# Patient Record
Sex: Female | Born: 1935
Health system: Southern US, Community
[De-identification: ages and names within clinical notes are randomized; demographics above are authoritative.]

## PROBLEM LIST (undated history)

## (undated) DIAGNOSIS — I10 Essential (primary) hypertension: Secondary | ICD-10-CM

## (undated) DIAGNOSIS — F0150 Vascular dementia without behavioral disturbance: Secondary | ICD-10-CM

## (undated) HISTORY — DX: Essential (primary) hypertension: I10

## (undated) HISTORY — PX: CATARACT EXTRACTION: SUR2

---

## 2005-01-17 ENCOUNTER — Ambulatory Visit: Payer: Self-pay | Admitting: Internal Medicine

## 2005-04-18 ENCOUNTER — Ambulatory Visit: Payer: Self-pay | Admitting: Internal Medicine

## 2006-08-21 ENCOUNTER — Ambulatory Visit: Payer: Self-pay | Admitting: Internal Medicine

## 2007-10-31 ENCOUNTER — Ambulatory Visit: Payer: Self-pay | Admitting: Internal Medicine

## 2008-09-10 ENCOUNTER — Emergency Department: Payer: Self-pay | Admitting: Emergency Medicine

## 2008-09-13 ENCOUNTER — Emergency Department: Payer: Self-pay | Admitting: Emergency Medicine

## 2009-01-16 ENCOUNTER — Ambulatory Visit: Payer: Self-pay | Admitting: Internal Medicine

## 2009-03-16 LAB — HM DEXA SCAN: HM Dexa Scan: ABNORMAL

## 2009-03-16 LAB — HM MAMMOGRAPHY: HM Mammogram: NORMAL

## 2009-03-30 ENCOUNTER — Ambulatory Visit: Payer: Self-pay | Admitting: Internal Medicine

## 2009-04-01 ENCOUNTER — Ambulatory Visit: Payer: Self-pay | Admitting: Internal Medicine

## 2009-10-04 ENCOUNTER — Encounter: Payer: Self-pay | Admitting: Internal Medicine

## 2009-10-14 ENCOUNTER — Encounter: Payer: Self-pay | Admitting: Internal Medicine

## 2009-11-13 ENCOUNTER — Encounter: Payer: Self-pay | Admitting: Internal Medicine

## 2010-02-28 ENCOUNTER — Encounter: Payer: Self-pay | Admitting: Orthopedic Surgery

## 2010-03-25 ENCOUNTER — Ambulatory Visit: Payer: Self-pay | Admitting: Internal Medicine

## 2010-04-14 ENCOUNTER — Ambulatory Visit: Payer: Self-pay | Admitting: Internal Medicine

## 2010-05-15 ENCOUNTER — Ambulatory Visit: Payer: Self-pay | Admitting: Internal Medicine

## 2010-11-02 ENCOUNTER — Encounter: Payer: Self-pay | Admitting: Internal Medicine

## 2010-11-02 ENCOUNTER — Ambulatory Visit (INDEPENDENT_AMBULATORY_CARE_PROVIDER_SITE_OTHER): Payer: MEDICARE | Admitting: Internal Medicine

## 2010-11-02 DIAGNOSIS — M25519 Pain in unspecified shoulder: Secondary | ICD-10-CM

## 2010-11-02 DIAGNOSIS — I10 Essential (primary) hypertension: Secondary | ICD-10-CM

## 2010-11-02 DIAGNOSIS — R5383 Other fatigue: Secondary | ICD-10-CM

## 2010-11-02 DIAGNOSIS — Z1239 Encounter for other screening for malignant neoplasm of breast: Secondary | ICD-10-CM

## 2010-11-02 DIAGNOSIS — E119 Type 2 diabetes mellitus without complications: Secondary | ICD-10-CM

## 2010-11-02 DIAGNOSIS — R5381 Other malaise: Secondary | ICD-10-CM

## 2010-11-02 DIAGNOSIS — M810 Age-related osteoporosis without current pathological fracture: Secondary | ICD-10-CM

## 2010-11-02 NOTE — Progress Notes (Signed)
  Subjective:    Patient ID: Kaitlin Edwards, female    DOB: 03-05-1920, 77 y.o.   MRN: [id number]  HPI  77 yo white female with history of DM , managed with oral medications, and glaucoma presesnts for regular followup.  Has been receiving regular intracular injections by Dr. Champ Mungo for sudden onset of vision loss secondary to macular degeneration diagnosed in  April 2012.  There has been improvement in vision note by patient since then.  She has brought a log of her fasting blood sugars for the past 3 months, which are all < 130.  No lows..  Denies numbness in feet,     Review of Systems  Constitutional: Negative for fever, chills and unexpected weight change.  HENT: Negative for hearing loss, ear pain, nosebleeds, congestion, sore throat, facial swelling, rhinorrhea, sneezing, mouth sores, trouble swallowing, neck pain, neck stiffness, voice change, postnasal drip, sinus pressure, tinnitus and ear discharge.   Eyes: Negative for pain, discharge, redness and visual disturbance.  Respiratory: Negative for cough, chest tightness, shortness of breath, wheezing and stridor.   Cardiovascular: Negative for chest pain, palpitations and leg swelling.  Musculoskeletal: Positive for arthralgias. Negative for myalgias.  Skin: Negative for color change and rash.  Neurological: Negative for dizziness, weakness, light-headedness and headaches.  Hematological: Negative for adenopathy.       Objective:   Physical Exam  Constitutional: She is oriented to person, place, and time. She appears well-developed and well-nourished.  HENT:  Mouth/Throat: Oropharynx is clear and moist.  Eyes: EOM are normal. Pupils are equal, round, and reactive to light. No scleral icterus.  Neck: Normal range of motion. Neck supple. No JVD present. No thyromegaly present.  Cardiovascular: Normal rate, regular rhythm, normal heart sounds and intact distal pulses.   Pulmonary/Chest: Effort normal and breath sounds normal.    Abdominal: Soft. Bowel sounds are normal. She exhibits no mass. There is no tenderness.  Musculoskeletal: Normal range of motion. She exhibits no edema.  Lymphadenopathy:    She has no cervical adenopathy.  Neurological: She is alert and oriented to person, place, and time. She displays normal reflexes. No cranial nerve deficit or sensory deficit. Coordination and gait normal.       Sensation intact to microfilament   Skin: Skin is warm and dry.  Psychiatric: She has a normal mood and affect.          Assessment & Plan:

## 2010-11-02 NOTE — Patient Instructions (Addendum)
Your blood pressure is elevated today.  You should be taking lisinopril 40 mg daily for your blood pressure.  Please confirm with your bottle at home and call us back.   Your diabetes control is excellent.  Continue your glipizide as you are doing..  You may use up to 4 tramadol daily if you need to for control of pain.    Ask Boyd Kerbs to draw a HgbA1c , CMET , TSH.    Return in 3 months.

## 2010-11-03 ENCOUNTER — Telehealth: Payer: Self-pay | Admitting: Internal Medicine

## 2010-11-03 ENCOUNTER — Other Ambulatory Visit: Payer: Self-pay | Admitting: Internal Medicine

## 2010-11-03 NOTE — Telephone Encounter (Signed)
Yes, i would like her to stay on the losartan.  I would like her ot have her bp checked a few times by the nurse at her facility to see if we are getting similar readings at home before we make any changes.

## 2010-11-03 NOTE — Telephone Encounter (Signed)
Notified patient of the message

## 2010-11-03 NOTE — Telephone Encounter (Signed)
Patient called and wanted to give the information that she did not have at her visit yesterday.  She stated she is taking losartan 100 mg once a day for BP, and she is also taking Tylenol PM at bedtime for sleep.  She wanted to know if you wanted to keep her on losartan because of her BP being elevated yesterday.  I have updated her chart.   Please advise

## 2010-11-05 ENCOUNTER — Encounter: Payer: Self-pay | Admitting: Internal Medicine

## 2010-11-05 DIAGNOSIS — E1139 Type 2 diabetes mellitus with other diabetic ophthalmic complication: Secondary | ICD-10-CM | POA: Insufficient documentation

## 2010-11-05 DIAGNOSIS — Z1239 Encounter for other screening for malignant neoplasm of breast: Secondary | ICD-10-CM | POA: Insufficient documentation

## 2010-11-05 DIAGNOSIS — M810 Age-related osteoporosis without current pathological fracture: Secondary | ICD-10-CM | POA: Insufficient documentation

## 2010-11-05 DIAGNOSIS — I10 Essential (primary) hypertension: Secondary | ICD-10-CM | POA: Insufficient documentation

## 2010-11-05 DIAGNOSIS — H409 Unspecified glaucoma: Secondary | ICD-10-CM | POA: Insufficient documentation

## 2010-11-05 NOTE — Assessment & Plan Note (Addendum)
Elevated today, with recent change from lisinopril to losartan. She will call us back to confirm what she is currently taking and will have [facility] RN check BP and pulse several times over the next week

## 2010-11-22 ENCOUNTER — Encounter: Payer: Self-pay | Admitting: Internal Medicine

## 2011-02-02 ENCOUNTER — Ambulatory Visit (INDEPENDENT_AMBULATORY_CARE_PROVIDER_SITE_OTHER): Payer: MEDICARE | Admitting: Internal Medicine

## 2011-02-02 ENCOUNTER — Encounter: Payer: Self-pay | Admitting: Internal Medicine

## 2011-02-02 VITALS — BP 140/80 | HR 78 | Temp 98.0°F | Ht 63.5 in | Wt 133.0 lb

## 2011-02-02 DIAGNOSIS — I10 Essential (primary) hypertension: Secondary | ICD-10-CM

## 2011-02-02 DIAGNOSIS — R430 Anosmia: Secondary | ICD-10-CM

## 2011-02-02 DIAGNOSIS — R5383 Other fatigue: Secondary | ICD-10-CM

## 2011-02-02 DIAGNOSIS — R439 Unspecified disturbances of smell and taste: Secondary | ICD-10-CM

## 2011-02-02 DIAGNOSIS — E119 Type 2 diabetes mellitus without complications: Secondary | ICD-10-CM

## 2011-02-02 NOTE — Progress Notes (Signed)
  Subjective:    Patient ID: Kaitlin Edwards, female    DOB: [birth year], 77 y.o.   MRN: [id number]  HPI      Review of Systems     Objective:   Physical Exam        Assessment & Plan:   Subjective:     [name] is a 77 y.o. female who presents for follow up of diabetes.. Current symptoms include: none. Patient denies hypoglycemia . Evaluation to date has been: fasting blood sugar, fasting lipid panel, hemoglobin A1C and microalbuminuria. Home sugars: BGs consistently in an acceptable range. Current treatments: Continued sulfonylurea which has been effective. Last dilated eye exam April 2012.  The following portions of the patient's history were reviewed and updated as appropriate: allergies, current medications, past family history, past medical history, past social history, past surgical history and problem list.  Review of Systems A comprehensive review of systems was negative except for: Ears, nose, mouth, throat, and face: positive for loss of sense of smell and taste    Objective:    BP 140/80  Pulse 78  Temp(Src) 98 F (36.7 C) (Oral)  Ht 5' 3.5" (1.613 m)  Wt 133 lb (60.328 kg)  BMI 23.19 kg/m2 General appearance: alert, cooperative and appears stated age Nose: Nares normal. Septum midline. Mucosa normal. No drainage or sinus tenderness. Throat: lips, mucosa, and tongue normal; teeth and gums normal Lungs: clear to auscultation bilaterally Heart: regular rate and rhythm, S1, S2 normal, no murmur, click, rub or gallop Extremities: extremities normal, atraumatic, no cyanosis or edema Pulses: 2+ and symmetric    @DMFOOTEXAM @  Patient was evaluated for proper footwear and sizing.  Laboratory: No components found with this basename: A1C      Assessment:    Diabetes mellitus Type II, under excellent control.    Plan:    Discussed general issues about diabetes pathophysiology and management. Encouraged aerobic exercise. Reminded to get yearly retinal  exam. Continued sulfonylurea; see medication orders.

## 2011-02-02 NOTE — Patient Instructions (Signed)
You do not have to check your sugar daily anymore,  Once a week will be fine,  Or more often if you feel bad.  We are checking your hgba1c today.  Return in 3 months

## 2011-02-03 LAB — COMPREHENSIVE METABOLIC PANEL
Alkaline Phosphatase: 65 U/L (ref 39–117)
CO2: 25 mEq/L (ref 19–32)
Creatinine, Ser: 0.9 mg/dL (ref 0.4–1.2)
GFR: 64.46 mL/min (ref >60.00)
Glucose, Bld: 84 mg/dL (ref 70–99)
Sodium: 138 mEq/L (ref 135–145)
Total Bilirubin: 1 mg/dL (ref 0.3–1.2)
Total Protein: 7.4 g/dL (ref 6.0–8.3)

## 2011-02-03 LAB — TSH: TSH: 0.94 u[IU]/mL (ref 0.35–5.50)

## 2011-02-05 DIAGNOSIS — R430 Anosmia: Secondary | ICD-10-CM | POA: Insufficient documentation

## 2011-02-05 NOTE — Assessment & Plan Note (Signed)
The source of her anosmia is unclear.  She has not had a recent URI and takes no medications that may be causing it.  Given her age need to rule out CNS tumor.  MRi ordered.

## 2011-02-10 ENCOUNTER — Ambulatory Visit: Payer: Self-pay | Admitting: Internal Medicine

## 2011-02-13 ENCOUNTER — Telehealth: Payer: Self-pay | Admitting: Internal Medicine

## 2011-02-13 DIAGNOSIS — R432 Parageusia: Secondary | ICD-10-CM

## 2011-02-13 NOTE — Telephone Encounter (Signed)
Her MRI of brain which was done because of loss of taste and smell did not show an acute stroke  Or a mass or tumor. If she is still having decreased taste and smell I recommend we refer to  ENT.

## 2011-02-15 NOTE — Telephone Encounter (Signed)
[facility] referral put in through [facility]

## 2011-02-15 NOTE — Telephone Encounter (Signed)
Advised pt of results.  She is still having the problems with decreased smell and taste.  She agrees to referral, wants to see someone in [place].

## 2011-02-15 NOTE — Telephone Encounter (Signed)
Addended by: Duncan Dull on: 02/15/2011 05:55 PM   Modules accepted: Orders

## 2011-02-21 ENCOUNTER — Encounter: Payer: Self-pay | Admitting: Internal Medicine

## 2011-03-09 LAB — HM DIABETES EYE EXAM

## 2011-03-20 ENCOUNTER — Telehealth: Payer: Self-pay | Admitting: Internal Medicine

## 2011-03-20 MED ORDER — GLIPIZIDE 5 MG PO TABS: 5.0000 mg | ORAL_TABLET | Freq: Two times a day (BID) | ORAL | Status: DC

## 2011-03-20 MED ORDER — LOSARTAN POTASSIUM 100 MG PO TABS: 100.0000 mg | ORAL_TABLET | Freq: Every day | ORAL | Status: DC

## 2011-03-20 NOTE — Telephone Encounter (Signed)
[phone] Pt needs refill rx glipizide and losartan [facility]   Mail order pharmacy

## 2011-03-27 ENCOUNTER — Telehealth: Payer: Self-pay | Admitting: Internal Medicine

## 2011-03-27 MED ORDER — LOSARTAN POTASSIUM 100 MG PO TABS: 100.0000 mg | ORAL_TABLET | Freq: Every day | ORAL | Status: DC

## 2011-03-27 MED ORDER — GLIPIZIDE 5 MG PO TABS: ORAL_TABLET | ORAL | Status: DC

## 2011-03-27 NOTE — Telephone Encounter (Signed)
Left message for patient to call back  

## 2011-03-27 NOTE — Telephone Encounter (Signed)
Office Message [address] [place], [place] [address] p. [phone] f. [phone] To: [facility] (Daytime Triage) Fax: [phone] From: [facility] Date/ Time: 03/27/2011 2:01 PM Taken By: Annalee Genta, CSR Caller: Myriam Jacobson Facility: not collected Patient: Kedra, Mcglade DOB: October 12, 1920 Phone: [phone] Reason for Call: Please call pt re refills for Losartan and Glipizide. She would like these mailed to her home ([address], [address] [place], [place] [address] )but is almost out of Glipizide. Please call pt to advise. Jacki Cones had left message for her, please don't leave message but call back) Regarding Appointment: Appt Date: Appt Time: Unknown Provider: Reason: Details: Outcome:

## 2011-03-27 NOTE — Telephone Encounter (Signed)
[phone] PT CALLED CHECKING ON HER RX SHE WILL BE OUT OF HER GLIPIZIDE THIS WEEK.  SHE WILL BE OUT ON Thursday. AND HER LOSARTIN IS ALMOST OUT   PLEASE ADVISE WHEN THIS IS CALLED IN OR HAS THIS BEEN CALLED IN THE REQUEST WAS MADE 2/4

## 2011-03-27 NOTE — Telephone Encounter (Signed)
Meds sent to [facility], pt advised.

## 2011-03-31 ENCOUNTER — Telehealth: Payer: Self-pay | Admitting: Internal Medicine

## 2011-04-03 NOTE — Telephone Encounter (Signed)
Left message for patient to call back  

## 2011-04-04 NOTE — Telephone Encounter (Signed)
Called [facility] and clarified directions on glypizide script.  Pt states that she is taking one whole tablet in the morning and one half in the evening.  [facility] will send pt her medicine.

## 2011-05-04 ENCOUNTER — Encounter: Payer: Self-pay | Admitting: Internal Medicine

## 2011-05-04 ENCOUNTER — Ambulatory Visit (INDEPENDENT_AMBULATORY_CARE_PROVIDER_SITE_OTHER): Payer: MEDICARE | Admitting: Internal Medicine

## 2011-05-04 VITALS — BP 148/76 | HR 100 | Temp 98.0°F | Resp 14 | Wt 132.2 lb

## 2011-05-04 DIAGNOSIS — R430 Anosmia: Secondary | ICD-10-CM

## 2011-05-04 DIAGNOSIS — E119 Type 2 diabetes mellitus without complications: Secondary | ICD-10-CM

## 2011-05-04 DIAGNOSIS — R439 Unspecified disturbances of smell and taste: Secondary | ICD-10-CM

## 2011-05-04 DIAGNOSIS — Z1239 Encounter for other screening for malignant neoplasm of breast: Secondary | ICD-10-CM

## 2011-05-04 DIAGNOSIS — I10 Essential (primary) hypertension: Secondary | ICD-10-CM

## 2011-05-04 NOTE — Assessment & Plan Note (Addendum)
Controlled on losartan.  Stable renal function

## 2011-05-04 NOTE — Progress Notes (Signed)
Patient ID: Kaitlin Edwards, female   DOB: 01-20-1921, 78 y.o.   MRN: [id number]   Patient Active Problem List  Diagnoses  . Type 2 diabetes mellitus, controlled  . Glaucoma  . Hypertension  . Screening for breast cancer  . Osteoporosis, post-menopausal  . Anosmia    Subjective:  CC:   Chief Complaint  Patient presents with  . Follow-up    3 month    HPI:   Kaitlin Musseris a 78 y.o. female who presents for 3 month  followup on diabetes mellitus type 2.  She has been compliant with use of glipizide , 5 mg in the morning and 2.5 mg in the evening.  She has brought a list of her morning fasting sugars.  All.  fastings all < 120.  She denies recent lows.  She has been having a lot of gas but not cramping.  Her diet is rich in Cruciferous vegetables. She denies constipation or diarrhea. Her bowels moving regularly. She has occasional insomnia but uses  tylenol PM at bedtime ,  she has been having nocturia x 3.  She drinks at least 16 ounces of water in the evening after dinner. She has not had a urinalysis in recent months.  She is up-to-date with her eye exams. She denies any numbness or tingling in her feet and has no leg pain.   Past Medical History  Diagnosis Date  . Diabetes mellitus     diet controlled  . Glaucoma   . Hypertension     Past Surgical History  Procedure Date  . Cataract extraction          The following portions of the patient's history were reviewed and updated as appropriate: Allergies, current medications, and problem list.    Review of Systems:   12 Pt  review of systems was negative except those addressed in the HPI,     History   Social History  . Marital Status: Married    Spouse Name: N/A    Number of Children: N/A  . Years of Education: N/A   Occupational History  . Not on file.   Social History Main Topics  . Smoking status: Never Smoker   . Smokeless tobacco: Never Used  . Alcohol Use: Yes     occasional  . Drug Use: No  .  Sexually Active: Not on file   Other Topics Concern  . Not on file   Social History Narrative  . No narrative on file    Objective:  BP 148/76  Pulse 100  Temp(Src) 98 F (36.7 C) (Oral)  Resp 14  Wt 132 lb 4 oz (59.988 kg)  SpO2 98%  General appearance: alert, cooperative and appears stated age Ears: normal TM's and external ear canals both ears Throat: lips, mucosa, and tongue normal; teeth and gums normal Neck: no adenopathy, no carotid bruit, supple, symmetrical, trachea midline and thyroid not enlarged, symmetric, no tenderness/mass/nodules Back: symmetric, no curvature. ROM normal. No CVA tenderness. Lungs: clear to auscultation bilaterally Heart: regular rate and rhythm, S1, S2 normal, no murmur, click, rub or gallop Abdomen: soft, non-tender; bowel sounds normal; no masses,  no organomegaly Pulses: 2+ and symmetric Skin: Skin color, texture, turgor normal. No rashes or lesions Lymph nodes: Cervical, supraclavicular, and axillary nodes normal. Foot exam:  Nails are well trimmed,  No callouses,  Sensation intact to microfilament in 12 of 12 locations bilaterally.  Assessment and Plan:  Hypertension Controlled on losartan.  Stable renal function  Type 2 diabetes mellitus, controlled Her hemoglobin A1c is 6.7 reflecting excellent control. She has no signs of neuropathy. Her renal function is stable she is on losartan for  hypertension. She has no history of microalbuminuria . She is not on a statin given her extreme age. She has regular ophthalmology  Follow up with Dr. Champ Mungo for history of glaucoma and wet macular degeneration. Her foot exam was normal today.  Anosmia Etiology unclear. She had an MRI of the brain which was negative for any masses or strokes. She was then seen by Dr.Madison Chestine Spore of [facility] who found  no evidence of polyps or nasopharyngeal masses. He suggested that she use a steroid nasal spray for management of congestive symptoms which were  likely viral  in etiology  Screening for breast cancer She is due for repeat mammogram this will be scheduled for her today.    Updated Medication List Outpatient Encounter Prescriptions as of 05/04/2011  Medication Sig Dispense Refill  . aspirin 81 MG tablet Take 81 mg by mouth daily.      . brinzolamide (AZOPT) 1 % ophthalmic suspension 1 drop 2 (two) times daily.        . Calcium Carbonate-Vitamin D (CALCIUM + D) 600-200 MG-UNIT TABS Take 1 tablet by mouth 2 (two) times daily.        . cyanocobalamin (,VITAMIN B-12,) 1000 MCG/ML injection Inject 1,000 mcg into the muscle every 30 (thirty) days.        . fish oil-omega-3 fatty acids 1000 MG capsule Take 2 g by mouth daily.        Marland Kitchen glipiZIDE (GLUCOTROL) 5 MG tablet Take one tablet in the morning, one half tablet in the evening.  135 tablet  3  . glucose blood (ACCU-CHEK AVIVA) test strip 1 each by Other route 2 (two) times daily. Use as instructed       . Multiple Vitamins-Minerals (ICAPS PO) Take by mouth.        . traMADol (ULTRAM) 50 MG tablet Take 50 mg by mouth every 6 (six) hours as needed.        Marland Kitchen DISCONTD: diphenhydramine-acetaminophen (TYLENOL PM) 25-500 MG TABS Take 1 tablet by mouth at bedtime as needed.        Marland Kitchen DISCONTD: lisinopril (PRINIVIL,ZESTRIL) 20 MG tablet Take 20 mg by mouth 2 (two) times daily.      Marland Kitchen losartan (COZAAR) 100 MG tablet       . DISCONTD: gentamicin (GARAMYCIN) 0.3 % ophthalmic solution 1 drop every 4 (four) hours.        Marland Kitchen DISCONTD: losartan (COZAAR) 100 MG tablet Take 1 tablet (100 mg total) by mouth daily.  90 tablet  3     Orders Placed This Encounter  Procedures  . MM Digital Screening  . Hemoglobin A1c  . COMPLETE METABOLIC PANEL WITH GFR  . HM DIABETES EYE EXAM  . HM DIABETES FOOT EXAM    Return in about 3 months (around 08/04/2011).

## 2011-05-05 LAB — COMPLETE METABOLIC PANEL WITH GFR
Alkaline Phosphatase: 58 U/L (ref 39–117)
BUN: 20 mg/dL (ref 6–23)
CO2: 24 mEq/L (ref 19–32)
Creat: 0.82 mg/dL (ref 0.50–1.10)
GFR, Est African American: 74 mL/min
GFR, Est Non African American: 64 mL/min
Glucose, Bld: 62 mg/dL — ABNORMAL LOW (ref 70–99)
Total Bilirubin: 0.7 mg/dL (ref 0.3–1.2)
Total Protein: 6.8 g/dL (ref 6.0–8.3)

## 2011-05-07 NOTE — Assessment & Plan Note (Signed)
She is due for repeat mammogram this will be scheduled for her today.

## 2011-05-07 NOTE — Assessment & Plan Note (Signed)
Etiology unclear. She had an MRI of the brain which was negative for any masses or strokes. She was then seen by Dr.Madison Chestine Spore of [facility] who found  no evidence of polyps or nasopharyngeal masses. He suggested that she use a steroid nasal spray for management of congestive symptoms which were likely viral  in etiology

## 2011-05-07 NOTE — Assessment & Plan Note (Addendum)
Her hemoglobin A1c is 6.7 reflecting excellent control. She has no signs of neuropathy. Her renal function is stable she is on losartan for  hypertension. She has no history of microalbuminuria . She is not on a statin given her extreme age. She has regular ophthalmology  Follow up with Dr. Champ Mungo for history of glaucoma and wet macular degeneration. Her foot exam was normal today.

## 2011-05-30 ENCOUNTER — Telehealth: Payer: Self-pay | Admitting: Internal Medicine

## 2011-05-30 NOTE — Telephone Encounter (Signed)
i call pt it see if she had her mammogram schedule or if she had went.  Pt wanted to know with her being 78 years old is it necessary for her to have mammogram She goes to [facility]

## 2011-05-31 NOTE — Telephone Encounter (Signed)
Patient notified, she stated that was fine she would just discuss it with you at her visit.

## 2011-05-31 NOTE — Telephone Encounter (Signed)
It is not necessary to continue getting annual mammograms at her age.  We can either stop all together or  Decrease the frequemncy to evrry other year,  So do not schedule,  We will dicuss more at next visit.

## 2011-06-28 ENCOUNTER — Other Ambulatory Visit: Payer: Self-pay | Admitting: Internal Medicine

## 2011-06-28 MED ORDER — CYANOCOBALAMIN 1000 MCG/ML IJ SOLN: 1000.0000 ug | INTRAMUSCULAR | Status: DC

## 2011-07-10 ENCOUNTER — Inpatient Hospital Stay: Payer: Self-pay | Admitting: Internal Medicine

## 2011-07-10 LAB — COMPREHENSIVE METABOLIC PANEL
Albumin: 4 g/dL (ref 3.4–5.0)
Anion Gap: 10 (ref 7–16)
BUN: 17 mg/dL (ref 7–18)
Calcium, Total: 8.9 mg/dL (ref 8.5–10.1)
Chloride: 104 mmol/L (ref 98–107)
Co2: 23 mmol/L (ref 21–32)
Creatinine: 0.8 mg/dL (ref 0.60–1.30)
EGFR (Non-African Amer.): >60
Glucose: 197 mg/dL — ABNORMAL HIGH (ref 65–99)
Osmolality: 281 (ref 275–301)
Potassium: 3.8 mmol/L (ref 3.5–5.1)
SGOT(AST): 19 U/L (ref 15–37)
SGPT (ALT): 25 U/L
Sodium: 137 mmol/L (ref 136–145)
Total Protein: 7.9 g/dL (ref 6.4–8.2)

## 2011-07-10 LAB — CBC
HCT: 40.3 % (ref 35.0–47.0)
HGB: 13.6 g/dL (ref 12.0–16.0)
MCH: 29.4 pg (ref 26.0–34.0)
MCV: 87 fL (ref 80–100)
Platelet: 271 10*3/uL (ref 150–440)
RDW: 13.6 % (ref 11.5–14.5)

## 2011-07-10 LAB — MAGNESIUM: Magnesium: 1.9 mg/dL

## 2011-07-10 LAB — CK TOTAL AND CKMB (NOT AT ARMC)
CK, Total: 79 U/L (ref 21–215)
CK, Total: 85 U/L (ref 21–215)
CK-MB: 1.8 ng/mL (ref 0.5–3.6)
CK-MB: 2 ng/mL (ref 0.5–3.6)

## 2011-07-10 LAB — TROPONIN I
Troponin-I: 0.04 ng/mL
Troponin-I: 0.04 ng/mL

## 2011-07-11 ENCOUNTER — Telehealth: Payer: Self-pay | Admitting: Internal Medicine

## 2011-07-11 LAB — TROPONIN I: Troponin-I: 0.03 ng/mL

## 2011-07-11 LAB — LIPID PANEL
Cholesterol: 217 mg/dL — ABNORMAL HIGH (ref 0–200)
HDL Cholesterol: 83 mg/dL — ABNORMAL HIGH (ref 40–60)
Ldl Cholesterol, Calc: 122 mg/dL — ABNORMAL HIGH (ref 0–100)
Triglycerides: 59 mg/dL (ref 0–200)
VLDL Cholesterol, Calc: 12 mg/dL (ref 5–40)

## 2011-07-11 LAB — HEMOGLOBIN A1C: Hemoglobin A1C: 6.7 % — ABNORMAL HIGH (ref 4.2–6.3)

## 2011-07-11 LAB — CK TOTAL AND CKMB (NOT AT ARMC)
CK, Total: 82 U/L (ref 21–215)
CK-MB: 2 ng/mL (ref 0.5–3.6)

## 2011-07-11 NOTE — Telephone Encounter (Signed)
Patient is being discharged today from [facility].

## 2011-07-12 NOTE — Telephone Encounter (Signed)
Spoke with patient, she stated she is doing much better since she was in the hospital and she will bring her discharge summary and all medications with her to the appointment made on June 6th.  She was advised to call the office if she had any concerns prior to the appt.

## 2011-07-13 ENCOUNTER — Encounter: Payer: Self-pay | Admitting: Cardiovascular Disease

## 2011-07-17 ENCOUNTER — Ambulatory Visit: Payer: MEDICARE | Admitting: Internal Medicine

## 2011-07-20 ENCOUNTER — Encounter: Payer: Self-pay | Admitting: Internal Medicine

## 2011-07-20 ENCOUNTER — Ambulatory Visit (INDEPENDENT_AMBULATORY_CARE_PROVIDER_SITE_OTHER): Payer: MEDICARE | Admitting: Internal Medicine

## 2011-07-20 ENCOUNTER — Encounter: Payer: Self-pay | Admitting: Cardiovascular Disease

## 2011-07-20 VITALS — BP 142/74 | HR 67 | Temp 97.8°F | Resp 16 | Ht 63.0 in | Wt 131.5 lb

## 2011-07-20 DIAGNOSIS — I447 Left bundle-branch block, unspecified: Secondary | ICD-10-CM

## 2011-07-20 DIAGNOSIS — E119 Type 2 diabetes mellitus without complications: Secondary | ICD-10-CM

## 2011-07-20 DIAGNOSIS — I10 Essential (primary) hypertension: Secondary | ICD-10-CM

## 2011-07-20 MED ORDER — METOPROLOL TARTRATE 50 MG PO TABS: 25.0000 mg | ORAL_TABLET | Freq: Two times a day (BID) | ORAL | Status: DC

## 2011-07-20 MED ORDER — HYDROCHLOROTHIAZIDE 25 MG PO TABS: 25.0000 mg | ORAL_TABLET | Freq: Every day | ORAL | Status: DC

## 2011-07-20 NOTE — Patient Instructions (Addendum)
The metoprolol may be making you too tired.    Reduce the metoprolol to 1/2 tablet twice daily   (about 12 hours apart)  We are adding  HCTZ (a fluid pill) daily in the morning for relief of nighttime urination and for blood pressure  Continue losartan as you are doing, once daily, for blood pressure.   Have the RN at [facility] check  Fasting lasbs(blood test) on June 21st or later.  Do NOT take your glipizide that morning, but continue to take your medications for blood pressure .  (Take your glipizide with your breakfast afterward)

## 2011-07-20 NOTE — Progress Notes (Signed)
Patient ID: Kaitlin Edwards, female   DOB: 1920/02/17, 78 y.o.   MRN: [id number]  Patient Active Problem List  Diagnoses  . Type 2 diabetes mellitus, controlled  . Glaucoma  . Hypertension  . Screening for breast cancer  . Osteoporosis, post-menopausal  . Anosmia  . Left bundle branch block    Subjective:  CC:   Chief Complaint  Patient presents with  . Follow-up    Hospital    HPI:   Kaitlin Musseris a 78 y.o. female who presents  for hospital follow up.  She was admitted for accelerated hypertension with epistaxis on [facility] 07/10/11 and discharged on May 28 with an additional medication , metoprolol 50 mg q 12 hours.   Had an iatrogenic hypoglycemic episode  after receiving insulin in house.  Has resumed her evening glipizde bc she has not had any lows either before or after admission.  She was referred to Dr. Kirke Corin a outpatient for evlaution of a new LBBB nad sees him on Monday .  Her BPs since starting metoprolol are improved, with daily readings 120/50  Pulse 60 to 162/84(rare) and feels tired a lot but continues with her acitivites.  Has nocturia x 3 which is disruptive.  She has had some wt gain but denies orthopnea and fluid retention.   Past Medical History  Diagnosis Date  . Diabetes mellitus     diet controlled  . Glaucoma   . Hypertension     Past Surgical History  Procedure Date  . Cataract extraction          The following portions of the patient's history were reviewed and updated as appropriate: Allergies, current medications, and problem list.    Review of Systems:   12 Pt  review of systems was negative except those addressed in the HPI,     History   Social History  . Marital Status: Married    Spouse Name: N/A    Number of Children: N/A  . Years of Education: N/A   Occupational History  . Not on file.   Social History Main Topics  . Smoking status: Never Smoker   . Smokeless tobacco: Never Used  . Alcohol Use: No     occasional  .  Drug Use: No  . Sexually Active: Not on file   Other Topics Concern  . Not on file   Social History Narrative  . No narrative on file    Objective:  BP 142/74  Pulse 67  Temp(Src) 97.8 F (36.6 C) (Oral)  Resp 16  Ht 5\' 3"  (1.6 m)  Wt 131 lb 8 oz (59.648 kg)  BMI 23.29 kg/m2  SpO2 97%  General appearance: alert, cooperative and appears stated age Ears: normal TM's and external ear canals both ears Throat: lips, mucosa, and tongue normal; teeth and gums normal Neck: no adenopathy, no carotid bruit, supple, symmetrical, trachea midline and thyroid not enlarged, symmetric, no tenderness/mass/nodules Back: symmetric, no curvature. ROM normal. No CVA tenderness. Lungs: clear to auscultation bilaterally Heart: regular rate and rhythm, S1, S2 normal, no murmur, click, rub or gallop Abdomen: soft, non-tender; bowel sounds normal; no masses,  no organomegaly Pulses: 2+ and symmetric Skin: Skin color, texture, turgor normal. No rashes or lesions Lymph nodes: Cervical, supraclavicular, and axillary nodes normal.  Assessment and Plan:  Hypertension Controlled with addition of metoprolol  Added to losartan and hctz, started in house.   Type 2 diabetes mellitus, controlled No changes to medication.  Continue glipizide.   Left  bundle branch block She has no cardiac history and no prior EKG for comparison.  She has been referred to Dr. Kirke Corin for cardiologic evaluation in the setting of diabetes and hypertension.     Updated Medication List Outpatient Encounter Prescriptions as of 07/20/2011  Medication Sig Dispense Refill  . Acetaminophen (TYLENOL PO) Take by mouth as needed.      Marland Kitchen aspirin 81 MG tablet Take 81 mg by mouth daily.      . brinzolamide (AZOPT) 1 % ophthalmic suspension 1 drop 2 (two) times daily.        . Calcium Carbonate-Vitamin D (CALCIUM + D) 600-200 MG-UNIT TABS Take 1 tablet by mouth 2 (two) times daily.        . cyanocobalamin (,VITAMIN B-12,) 1000 MCG/ML  injection Inject 1 mL (1,000 mcg total) into the muscle every 30 (thirty) days.  10 mL  3  . docusate sodium (COLACE) 100 MG capsule Take 100 mg by mouth daily.      . fish oil-omega-3 fatty acids 1000 MG capsule Take 1,000 mg by mouth daily.       Marland Kitchen glipiZIDE (GLUCOTROL) 5 MG tablet Take 5 mg by mouth daily.      Marland Kitchen glucose blood (ACCU-CHEK AVIVA) test strip 1 each by Other route 2 (two) times daily. Use as instructed       . losartan (COZAAR) 100 MG tablet Take 100 mg by mouth daily.       . Multiple Vitamins-Minerals (ICAPS PO) Take by mouth.        . Simethicone (GAS RELIEF PO) Take by mouth. Takes 1-2 tablets prn      . traMADol (ULTRAM) 50 MG tablet Take 50 mg by mouth every 6 (six) hours as needed.        Marland Kitchen DISCONTD: glipiZIDE (GLUCOTROL) 5 MG tablet Take one tablet in the morning, one half tablet in the evening.  135 tablet  3  . DISCONTD: metoprolol succinate (TOPROL-XL) 50 MG 24 hr tablet Take 50 mg by mouth 2 (two) times daily. Take with or immediately following a meal.      . hydrochlorothiazide (HYDRODIURIL) 25 MG tablet Take 1 tablet (25 mg total) by mouth daily.  30 tablet  1  . metoprolol (LOPRESSOR) 50 MG tablet Take 0.5 tablets (25 mg total) by mouth 2 (two) times daily.  180 tablet  3     No orders of the defined types were placed in this encounter.    Return in about 3 months (around 10/20/2011).

## 2011-07-23 DIAGNOSIS — I447 Left bundle-branch block, unspecified: Secondary | ICD-10-CM | POA: Insufficient documentation

## 2011-07-23 NOTE — Assessment & Plan Note (Signed)
No changes to medication.  Continue glipizide.

## 2011-07-23 NOTE — Assessment & Plan Note (Addendum)
Well controlled with addition of metoprolol, but she is having excessive fatigue.  Will decrease the metoprolol continue losartan and add hctz.

## 2011-07-23 NOTE — Assessment & Plan Note (Signed)
She has no cardiac history and no prior EKG for comparison.  She has been referred to Dr. Kirke Corin for cardiologic evaluation in the setting of diabetes and hypertension.

## 2011-07-24 ENCOUNTER — Other Ambulatory Visit: Payer: Self-pay | Admitting: Internal Medicine

## 2011-07-24 ENCOUNTER — Ambulatory Visit (INDEPENDENT_AMBULATORY_CARE_PROVIDER_SITE_OTHER): Payer: Medicare Other | Admitting: Cardiovascular Disease

## 2011-07-24 ENCOUNTER — Encounter: Payer: Self-pay | Admitting: Cardiovascular Disease

## 2011-07-24 VITALS — BP 140/80 | HR 66 | Ht 63.0 in | Wt 131.5 lb

## 2011-07-24 DIAGNOSIS — I447 Left bundle-branch block, unspecified: Secondary | ICD-10-CM

## 2011-07-24 DIAGNOSIS — I10 Essential (primary) hypertension: Secondary | ICD-10-CM

## 2011-07-24 DIAGNOSIS — I509 Heart failure, unspecified: Secondary | ICD-10-CM

## 2011-07-24 DIAGNOSIS — I5022 Chronic systolic (congestive) heart failure: Secondary | ICD-10-CM

## 2011-07-24 NOTE — Assessment & Plan Note (Signed)
The patient was recently diagnosed with cardiomyopathy with an ejection fraction of 40% in the setting of uncontrolled hypertension. She is currently New York Heart Association class II and appears to be euvolemic. I discussed the etiology of cardiomyopathy and in her situation is most likely due to either hypertensive heart disease or ischemic heart disease. The wall motion abnormalities on the echocardiogram was likely due to left bundle branch block. However, underlying ischemia cannot be completely ruled out. I discussed with her different management options. Due to her age and lack of significant symptoms of angina or heart failure, it's reasonable to treat her medically initially and monitor her symptoms. She was already started her metoprolol in addition to losartan. If her symptoms worsen, we can consider proceeding with a pharmacologic nuclear stress test or even cardiac catheterization if needed.

## 2011-07-24 NOTE — Patient Instructions (Signed)
Check your blood pressure 3 times a week. Let us know if it is more than 150/90.  Continue same medications.  Follow up in 3 months.

## 2011-07-24 NOTE — Progress Notes (Signed)
HPI  Kaitlin Musseris a 78 y.o. female who was referred from [facility] for evaluation of newly diagnosed cardiomyopathy and an abnormal ECG. She was admitted for accelerated hypertension with epistaxis on [facility] 07/10/11 and discharged on May 28 with an additional medication , metoprolol 50 mg q 12 hours. Had an iatrogenic hypoglycemic episode after receiving insulin in house. Has resumed her evening glipizde bc she has not had any lows either before or after admission. Her BPs since starting metoprolol are improved, with daily readings 120/50 Pulse 60 to 162/84(rare) and feels tired a lot but continues with her acitivites.  She had an echocardiogram done which showed an ejection fraction of 40% with septal motion abnormalities suggestive of conduction disease as well as septal and distal anterior hypokinesis with evidence of moderate pulmonary hypertension. The patient is not aware of any previous cardiac history. She denies any chest pain, dyspnea orthopnea. She is actually very active and does water aerobics twice a week and regular aerobic exercise 3 times a week without significant limitations. She is able to go up 3 flights of stairs without symptoms.   No Known Allergies   Current Outpatient Prescriptions on File Prior to Visit  Medication Sig Dispense Refill  . Acetaminophen (TYLENOL PO) Take by mouth as needed.      Marland Kitchen aspirin 81 MG tablet Take 81 mg by mouth daily.      . brinzolamide (AZOPT) 1 % ophthalmic suspension 1 drop 2 (two) times daily.        . Calcium Carbonate-Vitamin D (CALCIUM + D) 600-200 MG-UNIT TABS Take 1 tablet by mouth 2 (two) times daily.        . cyanocobalamin (,VITAMIN B-12,) 1000 MCG/ML injection Inject 1 mL (1,000 mcg total) into the muscle every 30 (thirty) days.  10 mL  3  . docusate sodium (COLACE) 100 MG capsule Take 100 mg by mouth daily.      . fish oil-omega-3 fatty acids 1000 MG capsule Take 1,000 mg by mouth daily.       Marland Kitchen glipiZIDE (GLUCOTROL) 5 MG tablet  Take 1 tablet in the am and 1/2 tablet in the pm.      . glucose blood (ACCU-CHEK AVIVA) test strip 1 each by Other route 2 (two) times daily. Use as instructed       . hydrochlorothiazide (HYDRODIURIL) 25 MG tablet Take 1 tablet (25 mg total) by mouth daily.  30 tablet  1  . losartan (COZAAR) 100 MG tablet Take 100 mg by mouth daily.       . Multiple Vitamins-Minerals (ICAPS PO) Take by mouth.        . Simethicone (GAS RELIEF PO) Take by mouth. Takes 1-2 tablets prn      . traMADol (ULTRAM) 50 MG tablet Take 50 mg by mouth every 6 (six) hours as needed.        Marland Kitchen DISCONTD: diphenhydramine-acetaminophen (TYLENOL PM) 25-500 MG TABS Take 1 tablet by mouth at bedtime as needed.           Past Medical History  Diagnosis Date  . Diabetes mellitus     diet controlled  . Glaucoma   . Hypertension      Past Surgical History  Procedure Date  . Cataract extraction      Family History  Problem Relation Age of Onset  . Parkinsonism Mother   . Hypertension Brother      History   Social History  . Marital Status: Married  Spouse Name: N/A    Number of Children: N/A  . Years of Education: N/A   Occupational History  . Not on file.   Social History Main Topics  . Smoking status: Never Smoker   . Smokeless tobacco: Never Used  . Alcohol Use: No     occasional  . Drug Use: No  . Sexually Active: Not on file   Other Topics Concern  . Not on file   Social History Narrative  . No narrative on file     ROS Constitutional: Negative for fever, chills, diaphoresis, activity change, appetite change and fatigue.  HENT: Negative for hearing loss, nosebleeds, congestion, sore throat, facial swelling, drooling, trouble swallowing, neck pain, voice change, sinus pressure and tinnitus.  Eyes: Negative for photophobia, pain, discharge and visual disturbance.  Respiratory: Negative for apnea, cough, chest tightness, shortness of breath and wheezing.  Cardiovascular: Negative for chest  pain, palpitations and leg swelling.  Gastrointestinal: Negative for nausea, vomiting, abdominal pain, diarrhea, constipation, blood in stool and abdominal distention.  Genitourinary: Negative for dysuria, urgency, frequency, hematuria and decreased urine volume.  Musculoskeletal: Negative for myalgias, back pain, joint swelling, arthralgias and gait problem.  Skin: Negative for color change, pallor, rash and wound.  Neurological: Negative for dizziness, tremors, seizures, syncope, speech difficulty, weakness, light-headedness, numbness and headaches.  Psychiatric/Behavioral: Negative for suicidal ideas, hallucinations, behavioral problems and agitation. The patient is not nervous/anxious.     PHYSICAL EXAM   BP 140/80  Pulse 66  Ht 5\' 3"  (1.6 m)  Wt 131 lb 8 oz (59.648 kg)  BMI 23.29 kg/m2 Constitutional: She is oriented to person, place, and time. She appears well-developed and well-nourished. No distress.  HENT: No nasal discharge.  Head: Normocephalic and atraumatic.  Eyes: Pupils are equal and round. Right eye exhibits no discharge. Left eye exhibits no discharge.  Neck: Normal range of motion. Neck supple. No JVD present. No thyromegaly present.  Cardiovascular: Normal rate, regular rhythm, normal heart sounds. Exam reveals no gallop and no friction rub. There is a 1/6 systolic ejection murmur at the left sternal border.  Pulmonary/Chest: Effort normal and breath sounds normal. No stridor. No respiratory distress. She has no wheezes. She has no rales. She exhibits no tenderness.  Abdominal: Soft. Bowel sounds are normal. She exhibits no distension. There is no tenderness. There is no rebound and no guarding.  Musculoskeletal: Normal range of motion. She exhibits no edema and no tenderness.  Neurological: She is alert and oriented to person, place, and time. Coordination normal.  Skin: Skin is warm and dry. No rash noted. She is not diaphoretic. No erythema. No pallor.  Psychiatric:  She has a normal mood and affect. Her behavior is normal. Judgment and thought content normal.     EKG: Sinus  Rhythm  -First degree A-V block  -With run of ventricular ectopic beats  PRi = 244 -  Negative T-waves  - Anterior ischemia.  (Intermittent left bundle branch block)   ASSESSMENT AND PLAN

## 2011-07-24 NOTE — Assessment & Plan Note (Signed)
Her blood pressure seems to be better controlled. She initially did not tolerate higher dose of metoprolol which seems to be doing okay with 25 mg twice daily. We can consider switching to carvedilol in the future if needed.

## 2011-08-03 ENCOUNTER — Ambulatory Visit: Payer: MEDICARE | Admitting: Internal Medicine

## 2011-08-09 ENCOUNTER — Other Ambulatory Visit: Payer: Self-pay | Admitting: Internal Medicine

## 2011-08-09 NOTE — Telephone Encounter (Signed)
Pt called checking on rx for tramdol   [facility] called this in yesterday

## 2011-08-10 MED ORDER — TRAMADOL HCL 50 MG PO TABS: 50.0000 mg | ORAL_TABLET | Freq: Four times a day (QID) | ORAL | Status: DC | PRN

## 2011-08-10 NOTE — Telephone Encounter (Signed)
Rx sent electronically, patient advised via message left on machine at home.

## 2011-08-14 ENCOUNTER — Telehealth: Payer: Self-pay | Admitting: Internal Medicine

## 2011-08-14 NOTE — Telephone Encounter (Signed)
Patient notified of results.

## 2011-08-14 NOTE — Telephone Encounter (Signed)
Recent labs reviewed,  DM is under excellent control. No chnages needed

## 2011-08-16 ENCOUNTER — Other Ambulatory Visit: Payer: Self-pay | Admitting: Cardiovascular Disease

## 2011-08-16 MED ORDER — METOPROLOL SUCCINATE ER 25 MG PO TB24: 25.0000 mg | ORAL_TABLET | Freq: Two times a day (BID) | ORAL | Status: DC

## 2011-08-16 NOTE — Telephone Encounter (Signed)
Please send 90 day supply

## 2011-08-16 NOTE — Telephone Encounter (Signed)
Refilled Metoprolol

## 2011-08-19 DIAGNOSIS — I10 Essential (primary) hypertension: Secondary | ICD-10-CM

## 2011-08-19 DIAGNOSIS — I447 Left bundle-branch block, unspecified: Secondary | ICD-10-CM

## 2011-08-19 DIAGNOSIS — E119 Type 2 diabetes mellitus without complications: Secondary | ICD-10-CM

## 2011-10-25 ENCOUNTER — Ambulatory Visit: Payer: Medicare Other | Admitting: Internal Medicine

## 2011-10-26 ENCOUNTER — Encounter: Payer: Self-pay | Admitting: Cardiovascular Disease

## 2011-10-26 ENCOUNTER — Ambulatory Visit (INDEPENDENT_AMBULATORY_CARE_PROVIDER_SITE_OTHER): Payer: Medicare Other | Admitting: Cardiovascular Disease

## 2011-10-26 VITALS — BP 138/92 | HR 75 | Ht 64.0 in | Wt 133.5 lb

## 2011-10-26 DIAGNOSIS — I509 Heart failure, unspecified: Secondary | ICD-10-CM

## 2011-10-26 DIAGNOSIS — I447 Left bundle-branch block, unspecified: Secondary | ICD-10-CM

## 2011-10-26 DIAGNOSIS — I10 Essential (primary) hypertension: Secondary | ICD-10-CM

## 2011-10-26 DIAGNOSIS — I5022 Chronic systolic (congestive) heart failure: Secondary | ICD-10-CM

## 2011-10-26 MED ORDER — CARVEDILOL 3.125 MG PO TABS: 3.1250 mg | ORAL_TABLET | Freq: Two times a day (BID) | ORAL | Status: DC

## 2011-10-26 NOTE — Patient Instructions (Addendum)
Stop taking Metoprolol.  Start Carvedilol (Coreg) 3.125 mg twice daily.  Continue to monitor blood pressure.  Follow up in 3 months.

## 2011-10-26 NOTE — Assessment & Plan Note (Signed)
The patient has cardiomyopathy with an ejection fraction of 40% in the setting of uncontrolled hypertension. She is currently New York Heart Association class II and appears to be euvolemic. Due to her age, lack of significant symptoms related to this, no ischemic cardiac evaluation was pursued. Her main issue at this point seems to be fatigued after she was started on metoprolol. Thus, I will stop this and start her on carvedilol 3.125 mg twice daily which might be better tolerated. Continue treatment with losartan. She is to continue monitoring her blood pressure.

## 2011-10-26 NOTE — Assessment & Plan Note (Signed)
Her blood pressure is overall reasonably controlled.

## 2011-10-26 NOTE — Progress Notes (Signed)
HPI  This is a 78 y.o. pleasant female who is here today for a followup visit . She was seen recently for newly diagnosed cardiomyopathy and an abnormal ECG. She was admitted for accelerated hypertension with epistaxis on [facility] 07/10/11 and discharged on May 28 with an additional medication , metoprolol 50 mg q 12 hours. She had an echocardiogram done which showed an ejection fraction of 40% with septal motion abnormalities suggestive of conduction disease as well as septal and distal anterior hypokinesis with evidence of moderate pulmonary hypertension.  She is usually very  active and does water aerobics twice a week and regular aerobic exercise 3 times a week without significant limitations. She is able to go up 3 flights of stairs. However, since she has been on metoprolol, she has been complaining of increased fatigue. She does not have as much energy as before.  No Known Allergies   Current Outpatient Prescriptions on File Prior to Visit  Medication Sig Dispense Refill  . Acetaminophen (TYLENOL PO) Take by mouth as needed.      Marland Kitchen aspirin 81 MG tablet Take 81 mg by mouth daily.      . brinzolamide (AZOPT) 1 % ophthalmic suspension 1 drop 2 (two) times daily.        . Calcium Carbonate-Vitamin D (CALCIUM + D) 600-200 MG-UNIT TABS Take 1 tablet by mouth 2 (two) times daily.        . cyanocobalamin (,VITAMIN B-12,) 1000 MCG/ML injection Inject 1 mL (1,000 mcg total) into the muscle every 30 (thirty) days.  10 mL  3  . docusate sodium (COLACE) 100 MG capsule Take 100 mg by mouth daily.      . fish oil-omega-3 fatty acids 1000 MG capsule Take 1,000 mg by mouth daily.       Marland Kitchen glipiZIDE (GLUCOTROL) 5 MG tablet Take 1 tablet in the am and 1/2 tablet in the pm.      . glucose blood (ACCU-CHEK AVIVA) test strip 1 each by Other route 2 (two) times daily. Use as instructed       . losartan (COZAAR) 100 MG tablet Take 100 mg by mouth daily.       . Multiple Vitamins-Minerals (ICAPS PO) Take by mouth.         . Simethicone (GAS RELIEF PO) Take by mouth. Takes 1-2 tablets prn      . traMADol (ULTRAM) 50 MG tablet Take 1 tablet (50 mg total) by mouth every 6 (six) hours as needed.  30 tablet  3  . carvedilol (COREG) 3.125 MG tablet Take 1 tablet (3.125 mg total) by mouth 2 (two) times daily with a meal.  60 tablet  6  . DISCONTD: diphenhydramine-acetaminophen (TYLENOL PM) 25-500 MG TABS Take 1 tablet by mouth at bedtime as needed.           Past Medical History  Diagnosis Date  . Diabetes mellitus     diet controlled  . Glaucoma   . Hypertension      Past Surgical History  Procedure Date  . Cataract extraction      Family History  Problem Relation Age of Onset  . Parkinsonism Mother   . Hypertension Brother      History   Social History  . Marital Status: Married    Spouse Name: N/A    Number of Children: N/A  . Years of Education: N/A   Occupational History  . Not on file.   Social History Main Topics  .  Smoking status: Never Smoker   . Smokeless tobacco: Never Used  . Alcohol Use: No     occasional  . Drug Use: No  . Sexually Active: Not on file   Other Topics Concern  . Not on file   Social History Narrative  . No narrative on file      PHYSICAL EXAM   BP 138/92  Pulse 75  Ht 5\' 4"  (1.626 m)  Wt 133 lb 8 oz (60.555 kg)  BMI 22.92 kg/m2 Constitutional: She is oriented to person, place, and time. She appears well-developed and well-nourished. No distress.  HENT: No nasal discharge.  Head: Normocephalic and atraumatic.  Eyes: Pupils are equal and round. Right eye exhibits no discharge. Left eye exhibits no discharge.  Neck: Normal range of motion. Neck supple. No JVD present. No thyromegaly present.  Cardiovascular: Normal rate, regular rhythm, normal heart sounds. Exam reveals no gallop and no friction rub. No murmur heard.  Pulmonary/Chest: Effort normal and breath sounds normal. No stridor. No respiratory distress. She has no wheezes. She has  no rales. She exhibits no tenderness.  Abdominal: Soft. Bowel sounds are normal. She exhibits no distension. There is no tenderness. There is no rebound and no guarding.  Musculoskeletal: Normal range of motion. She exhibits no edema and no tenderness.  Neurological: She is alert and oriented to person, place, and time. Coordination normal.  Skin: Skin is warm and dry. No rash noted. She is not diaphoretic. No erythema. No pallor.  Psychiatric: She has a normal mood and affect. Her behavior is normal. Judgment and thought content normal.     EKG: Sinus  Rhythm  -First degree A-V block  PRi = 258 -Left bundle branch block and left axis.   ABNORMAL     ASSESSMENT AND PLAN

## 2011-11-06 ENCOUNTER — Ambulatory Visit (INDEPENDENT_AMBULATORY_CARE_PROVIDER_SITE_OTHER): Payer: Medicare Other | Admitting: Internal Medicine

## 2011-11-06 ENCOUNTER — Encounter: Payer: Self-pay | Admitting: Internal Medicine

## 2011-11-06 VITALS — BP 160/90 | HR 88 | Temp 98.0°F | Wt 133.8 lb

## 2011-11-06 DIAGNOSIS — F09 Unspecified mental disorder due to known physiological condition: Secondary | ICD-10-CM

## 2011-11-06 DIAGNOSIS — F0150 Vascular dementia without behavioral disturbance: Secondary | ICD-10-CM | POA: Insufficient documentation

## 2011-11-06 DIAGNOSIS — I5022 Chronic systolic (congestive) heart failure: Secondary | ICD-10-CM

## 2011-11-06 DIAGNOSIS — I10 Essential (primary) hypertension: Secondary | ICD-10-CM

## 2011-11-06 DIAGNOSIS — E538 Deficiency of other specified B group vitamins: Secondary | ICD-10-CM

## 2011-11-06 DIAGNOSIS — R4189 Other symptoms and signs involving cognitive functions and awareness: Secondary | ICD-10-CM

## 2011-11-06 DIAGNOSIS — E119 Type 2 diabetes mellitus without complications: Secondary | ICD-10-CM

## 2011-11-06 DIAGNOSIS — I509 Heart failure, unspecified: Secondary | ICD-10-CM

## 2011-11-06 MED ORDER — NEBIVOLOL HCL 5 MG PO TABS: 5.0000 mg | ORAL_TABLET | Freq: Every day | ORAL | Status: DC

## 2011-11-06 NOTE — Progress Notes (Signed)
Patient ID: Kaitlin Edwards, female   DOB: 1920/05/23, 78 y.o.   MRN: [id number]  Patient Active Problem List  Diagnosis  . Type 2 diabetes mellitus, controlled  . Glaucoma  . Hypertension  . Screening for breast cancer  . Osteoporosis, post-menopausal  . Anosmia  . Left bundle branch block  . Chronic systolic congestive heart failure  . Cognitive decline    Subjective:  CC:   Chief Complaint  Patient presents with  . Follow-up    HPI:   Kaitlin Edwards a 78 y.o. female who presents for follow up on chronic problems.  She was admitted to [facility] recently with new onset heart failure.  Started on beta blocker by Arida ,  But did not tolerate metoprolol due to excessive fatigue.  Currently carvedilol makes her feel fuzzy headed, and she is having trouble processing and remembering details.   Not sure if medication is the cause of her current symptoms. Her diabetes remains well controlled as evidenced by her log of blood sugars which are consistently below 130, with no hypoglycemic events on current glipizide dose.     Past Medical History  Diagnosis Date  . Diabetes mellitus     diet controlled  . Glaucoma   . Hypertension     Past Surgical History  Procedure Date  . Cataract extraction          The following portions of the patient's history were reviewed and updated as appropriate: Allergies, current medications, and problem list.    Review of Systems:   12 Pt  review of systems was negative except those addressed in the HPI,     History   Social History  . Marital Status: Married    Spouse Name: N/A    Number of Children: N/A  . Years of Education: N/A   Occupational History  . Not on file.   Social History Main Topics  . Smoking status: Never Smoker   . Smokeless tobacco: Never Used  . Alcohol Use: Yes     occasional-wine  . Drug Use: No  . Sexually Active: Not on file   Other Topics Concern  . Not on file   Social History Narrative  . No  narrative on file    Objective:  BP 160/90  Pulse 88  Temp 98 F (36.7 C) (Oral)  Wt 133 lb 12 oz (60.669 kg)  General appearance: alert, cooperative and appears stated age Ears: normal TM's and external ear canals both ears Throat: lips, mucosa, and tongue normal; teeth and gums normal Neck: no adenopathy, no carotid bruit, supple, symmetrical, trachea midline and thyroid not enlarged, symmetric, no tenderness/mass/nodules Back: symmetric, no curvature. ROM normal. No CVA tenderness. Lungs: clear to auscultation bilaterally Heart: regular rate and rhythm, S1, S2 normal, no murmur, click, rub or gallop Abdomen: soft, non-tender; bowel sounds normal; no masses,  no organomegaly Pulses: 2+ and symmetric Skin: Skin color, texture, turgor normal. No rashes or lesions Lymph nodes: Cervical, supraclavicular, and axillary nodes normal.  Assessment and Plan:  Hypertension Trial of bystolic 5 mg at bedtime to replace carvedilolol for persistnet mental fuszziness  Cognitive decline Likely early onset vascular dementia. I have noticed for some time patient has had some confusion regarding medications and directions. She has noted this herself lately but attributes it to the beta blocker that she's been prescribed for her new onset congestive heart failure. Mini-Mental Status exam was done today. She she had 0 recall of 3 objects at 1 minute. She  could spell world backwards after 2 tries. Her clock face drawing was fine she could recall 18 animals in 1 minute and 9 words beginning with the letter F. I think her symptoms are mild. I will recheck a B12 and a TSH level today. She had an MRI of the brain in December for loss of taste and smell. Deep white matter changes consistent with chronic ischemia was noted. She will return in one month and we will consider a trial of Aricept.  Chronic systolic congestive heart failure She is tolerating losartan but both beta blocker trials have caused increased  confusion. We are trying to switch to bystolic today.  Type 2 diabetes mellitus, controlled Blood sugars have been well controlled on current regimen. hgba1c to be checked today.   Updated Medication List Outpatient Encounter Prescriptions as of 11/06/2011  Medication Sig Dispense Refill  . aspirin 81 MG tablet Take 81 mg by mouth daily.      . brinzolamide (AZOPT) 1 % ophthalmic suspension 1 drop 2 (two) times daily.        . Calcium Carbonate-Vitamin D (CALCIUM + D) 600-200 MG-UNIT TABS Take 1 tablet by mouth 2 (two) times daily.        . cyanocobalamin (,VITAMIN B-12,) 1000 MCG/ML injection Inject 1 mL (1,000 mcg total) into the muscle every 30 (thirty) days.  10 mL  3  . diphenhydramine-acetaminophen (TYLENOL PM) 25-500 MG TABS Take 1 tablet by mouth at bedtime as needed.      . docusate sodium (COLACE) 100 MG capsule Take 100 mg by mouth daily.      . fish oil-omega-3 fatty acids 1000 MG capsule Take 1,000 mg by mouth daily.       Marland Kitchen glipiZIDE (GLUCOTROL) 5 MG tablet Take 1 tablet in the am and 1/2 tablet in the pm.      . glucose blood (ACCU-CHEK AVIVA) test strip 1 each by Other route 2 (two) times daily. Use as instructed       . losartan (COZAAR) 100 MG tablet Take 100 mg by mouth daily.       . Multiple Vitamins-Minerals (ICAPS PO) Take by mouth.        . Simethicone (GAS RELIEF PO) Take by mouth. Takes 1-2 tablets prn      . traMADol (ULTRAM) 50 MG tablet Take 1 tablet (50 mg total) by mouth every 6 (six) hours as needed.  30 tablet  3  . DISCONTD: carvedilol (COREG) 3.125 MG tablet Take 1 tablet (3.125 mg total) by mouth 2 (two) times daily with a meal.  60 tablet  6  . nebivolol (BYSTOLIC) 5 MG tablet Take 1 tablet (5 mg total) by mouth daily.  30 tablet  0  . DISCONTD: Acetaminophen (TYLENOL PO) Take by mouth as needed.         Orders Placed This Encounter  Procedures  . TSH  . Vitamin B12  . Microalbumin / creatinine urine ratio  . Hemoglobin A1c  . Comprehensive  metabolic panel    Return in about 1 month (around 12/06/2011).

## 2011-11-06 NOTE — Assessment & Plan Note (Signed)
Trial of bystolic 5 mg at bedtime to replace carvedilolol for persistnet mental fuszziness

## 2011-11-06 NOTE — Assessment & Plan Note (Signed)
Blood sugars have been well controlled on current regimen. hgba1c to be checked today.

## 2011-11-06 NOTE — Patient Instructions (Addendum)
I am replacing your blood pressure medication,  Carvedilol,   with bystolic .  Please start it tonight at bedtime,  Just one tablet daily at bedtime.   Please let me know if it does not improve your "fuzziness."   Your blood sugars look fine. continue your current dose of glipizide  I recommend taking either Metamucil.  Citrucel or miralax for your bowels instead of the stool softener. . They are all fiber supplements   We will call you with the results of your bloodwork.  Get the free flu vaccine at [facility].   Return in one month to check on your memory

## 2011-11-06 NOTE — Assessment & Plan Note (Signed)
She is tolerating losartan but both beta blocker trials have caused increased confusion. We are trying to switch to bystolic today.

## 2011-11-06 NOTE — Assessment & Plan Note (Signed)
Likely early onset vascular dementia. I have noticed for some time patient has had some confusion regarding medications and directions. She has noted this herself lately but attributes it to the beta blocker that she's been prescribed for her new onset congestive heart failure. Mini-Mental Status exam was done today. She she had 0 recall of 3 objects at 1 minute. She could spell world backwards after 2 tries. Her clock face drawing was fine she could recall 18 animals in 1 minute and 9 words beginning with the letter F. I think her symptoms are mild. I will recheck a B12 and a TSH level today. She had an MRI of the brain in December for loss of taste and smell. Deep white matter changes consistent with chronic ischemia was noted. She will return in one month and we will consider a trial of Aricept.

## 2011-11-07 LAB — COMPREHENSIVE METABOLIC PANEL
AST: 27 U/L (ref 0–37)
Alkaline Phosphatase: 53 U/L (ref 39–117)
BUN: 25 mg/dL — ABNORMAL HIGH (ref 6–23)
Glucose, Bld: 120 mg/dL — ABNORMAL HIGH (ref 70–99)
Potassium: 4.7 mEq/L (ref 3.5–5.1)
Total Bilirubin: 0.7 mg/dL (ref 0.3–1.2)

## 2011-11-07 LAB — MICROALBUMIN / CREATININE URINE RATIO
Creatinine,U: 79.7 mg/dL
Microalb Creat Ratio: 0.8 mg/g (ref 0.0–30.0)
Microalb, Ur: 0.6 mg/dL (ref 0.0–1.9)

## 2011-11-07 LAB — VITAMIN B12: Vitamin B-12: 524 pg/mL (ref 211–911)

## 2011-11-24 ENCOUNTER — Telehealth: Payer: Self-pay | Admitting: Internal Medicine

## 2011-11-24 MED ORDER — LOSARTAN POTASSIUM 100 MG PO TABS: 100.0000 mg | ORAL_TABLET | Freq: Every day | ORAL | Status: DC

## 2011-11-24 NOTE — Telephone Encounter (Signed)
Refill request for losartan pot tab 10 mg Sig: take 1 by mouth daily  clarification is needed I have put form in your red folder.

## 2011-11-24 NOTE — Telephone Encounter (Signed)
I dion't know who has the red foler, but the medication doesn't com in 10 mg.  100mg  is the dose,  Sent to [facility].

## 2011-11-27 ENCOUNTER — Other Ambulatory Visit: Payer: Self-pay

## 2011-12-01 ENCOUNTER — Telehealth: Payer: Self-pay

## 2011-12-01 NOTE — Telephone Encounter (Signed)
Patient called stated that she will be taking her last pill of the Bystolic on Sunday, she has a follow up appointment on Thursday, she wants to know if she should go back on the metoprolol or should she come by for another sample to get her through her appointment?

## 2011-12-02 NOTE — Telephone Encounter (Signed)
Please check in the closet and see if we have any samples of Bystolic  5 mg.  If  we do tell her she can come by and get them on Monday

## 2011-12-04 NOTE — Telephone Encounter (Signed)
Patient came by to pick up Sample Bystolic 5 mg.

## 2011-12-07 ENCOUNTER — Ambulatory Visit (INDEPENDENT_AMBULATORY_CARE_PROVIDER_SITE_OTHER): Payer: Medicare Other | Admitting: Internal Medicine

## 2011-12-07 ENCOUNTER — Encounter: Payer: Self-pay | Admitting: Internal Medicine

## 2011-12-07 VITALS — BP 130/78 | HR 69 | Temp 98.4°F | Ht 64.5 in | Wt 134.8 lb

## 2011-12-07 DIAGNOSIS — R439 Unspecified disturbances of smell and taste: Secondary | ICD-10-CM

## 2011-12-07 DIAGNOSIS — I5022 Chronic systolic (congestive) heart failure: Secondary | ICD-10-CM

## 2011-12-07 DIAGNOSIS — I509 Heart failure, unspecified: Secondary | ICD-10-CM

## 2011-12-07 DIAGNOSIS — R430 Anosmia: Secondary | ICD-10-CM

## 2011-12-07 MED ORDER — CARVEDILOL 12.5 MG PO TABS: 12.5000 mg | ORAL_TABLET | Freq: Two times a day (BID) | ORAL | Status: DC

## 2011-12-07 MED ORDER — TRAMADOL HCL 50 MG PO TABS: 50.0000 mg | ORAL_TABLET | Freq: Four times a day (QID) | ORAL | Status: DC | PRN

## 2011-12-07 NOTE — Patient Instructions (Addendum)
Your insurance is forcing [place] to try another alternative to bystolic before  They will pay for it.  We will try carvedilol,  which is taken twice daily instead og bystolic once daily   I will hold samples of Bystolic in case you do not tolerate carvedilol that i have called in to [facility]

## 2011-12-07 NOTE — Progress Notes (Signed)
Patient ID: Kaitlin Edwards, female   DOB: 02-May-1920, 78 y.o.   MRN: [id number]  Patient Active Problem List  Diagnosis  . Type 2 diabetes mellitus, controlled  . Glaucoma  . Hypertension  . Screening for breast cancer  . Osteoporosis, post-menopausal  . Anosmia  . Left bundle branch block  . Chronic systolic congestive heart failure  . Cognitive decline    Subjective:  CC:   Chief Complaint  Patient presents with  . Follow-up    HPI:   Kaitlin Musseris a 78 y.o. female who presents for follow up on medication change to bystolic several weeks ago due to patient having increased cognitive deficits and and fatigue. She states that she has felt much better cognitively on the bystolic compared to when she was taken the metoprolol.   She reports improved energy and her macular degeneration has also  improved and she is noting improved vision.   However [employer] will not pay for bystolic and is forcing [place] to try another generic alternative.  ,     Past Medical History  Diagnosis Date  . Diabetes mellitus     diet controlled  . Glaucoma(365)   . Hypertension     Past Surgical History  Procedure Date  . Cataract extraction    The following portions of the patient's history were reviewed and updated as appropriate: Allergies, current medications, and problem list.    Review of Systems:   12 Pt  review of systems was negative except those addressed in the HPI,     History   Social History  . Marital Status: Married    Spouse Name: N/A    Number of Children: N/A  . Years of Education: N/A   Occupational History  . Not on file.   Social History Main Topics  . Smoking status: Never Smoker   . Smokeless tobacco: Never Used  . Alcohol Use: Yes     occasional-wine  . Drug Use: No  . Sexually Active: Not on file   Other Topics Concern  . Not on file   Social History Narrative  . No narrative on file    Objective:  BP 130/78  Pulse 69  Temp 98.4 F (36.9  C) (Oral)  Ht 5' 4.5" (1.638 m)  Wt 134 lb 12 oz (61.122 kg)  BMI 22.77 kg/m2  SpO2 97%  General appearance: alert, cooperative and appears stated age Ears: normal TM's and external ear canals both ears Throat: lips, mucosa, and tongue normal; teeth and gums normal Neck: no adenopathy, no carotid bruit, supple, symmetrical, trachea midline and thyroid not enlarged, symmetric, no tenderness/mass/nodules Back: symmetric, no curvature. ROM normal. No CVA tenderness. Lungs: clear to auscultation bilaterally Heart: regular rate and rhythm, S1, S2 normal, no murmur, click, rub or gallop Abdomen: soft, non-tender; bowel sounds normal; no masses,  no organomegaly Pulses: 2+ and symmetric Skin: Skin color, texture, turgor normal. No rashes or lesions Lymph nodes: Cervical, supraclavicular, and axillary nodes normal.  Assessment and Plan:  Chronic systolic congestive heart failure I have changed her metoprolol to by systolic and now to carvedilol since her insurance would not pay for Bystolic. She will return in one week let me know whether she feels the same cognitive deficits on the the carvedilol that she did on the metoprolol  Anosmia Ideology unclear. She had an MRI the brain which was negative for tumors.   Updated Medication List Outpatient Encounter Prescriptions as of 12/07/2011  Medication Sig Dispense Refill  .  aspirin 81 MG tablet Take 81 mg by mouth daily.      . brinzolamide (AZOPT) 1 % ophthalmic suspension 1 drop 2 (two) times daily.        . Calcium Carbonate-Vitamin D (CALCIUM + D) 600-200 MG-UNIT TABS Take 1 tablet by mouth 2 (two) times daily.        . cyanocobalamin (,VITAMIN B-12,) 1000 MCG/ML injection Inject 1 mL (1,000 mcg total) into the muscle every 30 (thirty) days.  10 mL  3  . diphenhydramine-acetaminophen (TYLENOL PM) 25-500 MG TABS Take 1 tablet by mouth at bedtime as needed.      . docusate sodium (COLACE) 100 MG capsule Take 100 mg by mouth daily.      .  fish oil-omega-3 fatty acids 1000 MG capsule Take 1,000 mg by mouth daily.       Marland Kitchen glipiZIDE (GLUCOTROL) 5 MG tablet Take 1 tablet in the am and 1/2 tablet in the pm.      . glucose blood (ACCU-CHEK AVIVA) test strip 1 each by Other route 2 (two) times daily. Use as instructed       . losartan (COZAAR) 100 MG tablet Take 1 tablet (100 mg total) by mouth daily.  30 tablet  11  . Multiple Vitamins-Minerals (ICAPS PO) Take by mouth.        . Simethicone (GAS RELIEF PO) Take by mouth. Takes 1-2 tablets prn      . traMADol (ULTRAM) 50 MG tablet Take 1 tablet (50 mg total) by mouth every 6 (six) hours as needed.  90 tablet  3  . DISCONTD: nebivolol (BYSTOLIC) 5 MG tablet Take 1 tablet (5 mg total) by mouth daily.  30 tablet  0  . DISCONTD: traMADol (ULTRAM) 50 MG tablet Take 1 tablet (50 mg total) by mouth every 6 (six) hours as needed.  30 tablet  3  . carvedilol (COREG) 12.5 MG tablet Take 1 tablet (12.5 mg total) by mouth 2 (two) times daily with a meal.  60 tablet  3     No orders of the defined types were placed in this encounter.    No Follow-up on file.

## 2011-12-09 ENCOUNTER — Encounter: Payer: Self-pay | Admitting: Internal Medicine

## 2011-12-09 NOTE — Assessment & Plan Note (Signed)
Ideology unclear. She had an MRI the brain which was negative for tumors.

## 2011-12-09 NOTE — Assessment & Plan Note (Signed)
I have changed her metoprolol to by systolic and now to carvedilol since her insurance would not pay for Bystolic. She will return in one week let me know whether she feels the same cognitive deficits on the the carvedilol that she did on the metoprolol

## 2011-12-13 ENCOUNTER — Telehealth: Payer: Self-pay | Admitting: Internal Medicine

## 2011-12-13 NOTE — Telephone Encounter (Signed)
error 

## 2011-12-20 ENCOUNTER — Other Ambulatory Visit: Payer: Self-pay | Admitting: Internal Medicine

## 2011-12-20 MED ORDER — LOSARTAN POTASSIUM 100 MG PO TABS: 100.0000 mg | ORAL_TABLET | Freq: Every day | ORAL | Status: DC

## 2011-12-20 MED ORDER — GLIPIZIDE 5 MG PO TABS: ORAL_TABLET | ORAL | Status: DC

## 2011-12-20 NOTE — Telephone Encounter (Signed)
Pt is needing refill on Losartin and Glipizide [facility].

## 2011-12-20 NOTE — Telephone Encounter (Signed)
Rx sent to [facility], patient advised via telephone.

## 2012-01-03 ENCOUNTER — Other Ambulatory Visit: Payer: Self-pay | Admitting: Internal Medicine

## 2012-01-03 NOTE — Telephone Encounter (Signed)
Pt called to get refill on accu check test strips [facility] [address]

## 2012-01-03 NOTE — Telephone Encounter (Signed)
Pt called back she also need accu check lancets

## 2012-01-05 ENCOUNTER — Other Ambulatory Visit: Payer: Self-pay

## 2012-01-05 MED ORDER — GLUCOSE BLOOD VI STRP: ORAL_STRIP | Status: DC

## 2012-01-05 MED ORDER — ACCU-CHEK SOFTCLIX LANCETS MISC: 1.0000 | Freq: Every day | Status: AC

## 2012-01-05 NOTE — Telephone Encounter (Signed)
Test strips and lancets sent electronic to [facility] pharmacy.

## 2012-01-25 ENCOUNTER — Ambulatory Visit (INDEPENDENT_AMBULATORY_CARE_PROVIDER_SITE_OTHER): Payer: Medicare Other | Admitting: Cardiovascular Disease

## 2012-01-25 ENCOUNTER — Encounter: Payer: Self-pay | Admitting: Cardiovascular Disease

## 2012-01-25 VITALS — BP 152/70 | HR 77 | Ht 64.0 in | Wt 135.8 lb

## 2012-01-25 DIAGNOSIS — I10 Essential (primary) hypertension: Secondary | ICD-10-CM

## 2012-01-25 DIAGNOSIS — I447 Left bundle-branch block, unspecified: Secondary | ICD-10-CM

## 2012-01-25 DIAGNOSIS — I509 Heart failure, unspecified: Secondary | ICD-10-CM

## 2012-01-25 DIAGNOSIS — I5022 Chronic systolic (congestive) heart failure: Secondary | ICD-10-CM

## 2012-01-25 NOTE — Assessment & Plan Note (Signed)
The patient has cardiomyopathy with an ejection fraction of 40% in the setting of uncontrolled hypertension. She is currently New York Heart Association class II and appears to be euvolemic. Due to her age, lack of significant symptoms related to this, no ischemic cardiac evaluation was pursued. Continue treatment with losartan and Bystolic.

## 2012-01-25 NOTE — Progress Notes (Signed)
HPI  This is a 78 y.o. pleasant female who is here today for a followup visit regarding cardiomyopathy and an abnormal ECG. She was admitted to [facility] in May of 2013 for accelerated hypertension with epistaxis She had an echocardiogram done which showed an ejection fraction of 40% with septal motion abnormalities suggestive of conduction disease as well as septal and distal anterior hypokinesis with evidence of moderate pulmonary hypertension.  She was treated with metoprolol but did not tolerate it due to fatigue. She also did not tolerate carvedilol well for the same reason. She was switched to Bystolic by Dr. Darrick Huntsman and has been tolerating the medication. Overall, she has been doing well with no chest pain or dyspnea. Her home blood pressure readings seem to be mostly optimal. She continues to exercise regularly without significant limitations.  No Known Allergies   Current Outpatient Prescriptions on File Prior to Visit  Medication Sig Dispense Refill  . ACCU-CHEK SOFTCLIX LANCETS lancets 1 each by Other route daily. Use as instructed  100 each  3  . aspirin 81 MG tablet Take 81 mg by mouth daily.      . brinzolamide (AZOPT) 1 % ophthalmic suspension 1 drop 2 (two) times daily.        . Calcium Carbonate-Vitamin D (CALCIUM + D) 600-200 MG-UNIT TABS Take 1 tablet by mouth 2 (two) times daily.        . cyanocobalamin (,VITAMIN B-12,) 1000 MCG/ML injection Inject 1 mL (1,000 mcg total) into the muscle every 30 (thirty) days.  10 mL  3  . diphenhydramine-acetaminophen (TYLENOL PM) 25-500 MG TABS Take 1 tablet by mouth at bedtime as needed.      . fish oil-omega-3 fatty acids 1000 MG capsule Take 1,000 mg by mouth daily.       Marland Kitchen glipiZIDE (GLUCOTROL) 5 MG tablet Take 1 tablet in the am and 1/2 tablet in the pm.  135 tablet  3  . glucose blood (ACCU-CHEK AVIVA) test strip 1 each by other route 2 times daily. Use as instructed.  100 each  3  . losartan (COZAAR) 100 MG tablet Take 1 tablet (100 mg  total) by mouth daily.  90 tablet  3  . Multiple Vitamins-Minerals (ICAPS PO) Take by mouth.        . Simethicone (GAS RELIEF PO) Take by mouth. Takes 1-2 tablets prn      . traMADol (ULTRAM) 50 MG tablet Take 1 tablet (50 mg total) by mouth every 6 (six) hours as needed.  90 tablet  3     Past Medical History  Diagnosis Date  . Diabetes mellitus     diet controlled  . Glaucoma(365)   . Hypertension      Past Surgical History  Procedure Date  . Cataract extraction      Family History  Problem Relation Age of Onset  . Parkinsonism Mother   . Hypertension Brother      History   Social History  . Marital Status: Married    Spouse Name: N/A    Number of Children: N/A  . Years of Education: N/A   Occupational History  . Not on file.   Social History Main Topics  . Smoking status: Never Smoker   . Smokeless tobacco: Never Used  . Alcohol Use: Yes     Comment: occasional-wine  . Drug Use: No  . Sexually Active: Not on file   Other Topics Concern  . Not on file   Social  History Narrative  . No narrative on file      PHYSICAL EXAM   BP 152/70  Pulse 77  Ht 5\' 4"  (1.626 m)  Wt 135 lb 12 oz (61.576 kg)  BMI 23.30 kg/m2 Constitutional: She is oriented to person, place, and time. She appears well-developed and well-nourished. No distress.  HENT: No nasal discharge.  Head: Normocephalic and atraumatic.  Eyes: Pupils are equal and round. Right eye exhibits no discharge. Left eye exhibits no discharge.  Neck: Normal range of motion. Neck supple. No JVD present. No thyromegaly present.  Cardiovascular: Normal rate, regular rhythm, normal heart sounds. Exam reveals no gallop and no friction rub. No murmur heard.  Pulmonary/Chest: Effort normal and breath sounds normal. No stridor. No respiratory distress. She has no wheezes. She has no rales. She exhibits no tenderness.  Abdominal: Soft. Bowel sounds are normal. She exhibits no distension. There is no tenderness.  There is no rebound and no guarding.  Musculoskeletal: Normal range of motion. She exhibits no edema and no tenderness.  Neurological: She is alert and oriented to person, place, and time. Coordination normal.  Skin: Skin is warm and dry. No rash noted. She is not diaphoretic. No erythema. No pallor.  Psychiatric: She has a normal mood and affect. Her behavior is normal. Judgment and thought content normal.     EKG: Sinus  Rhythm  -First degree A-V block  PRi = 254 -Left bundle branch block and left axis.   ABNORMAL     ASSESSMENT AND PLAN

## 2012-01-25 NOTE — Assessment & Plan Note (Signed)
Home blood pressure readings seem to be reasonable.

## 2012-01-25 NOTE — Patient Instructions (Addendum)
Continue same medications.  Follow up in 6 months.  

## 2012-02-05 ENCOUNTER — Telehealth: Payer: Self-pay | Admitting: Internal Medicine

## 2012-02-05 NOTE — Telephone Encounter (Signed)
Patient wanting samples of Bystolic 5 mg  .

## 2012-02-05 NOTE — Telephone Encounter (Signed)
Samples placed up front 

## 2012-03-06 ENCOUNTER — Telehealth: Payer: Self-pay | Admitting: Internal Medicine

## 2012-03-06 NOTE — Telephone Encounter (Signed)
Yes bystolic 5 mg

## 2012-03-06 NOTE — Telephone Encounter (Signed)
Pt notified that the meds placed up front for pt to pick up.

## 2012-03-06 NOTE — Telephone Encounter (Signed)
Patient wanting samples of bystolic 5 mg. Please call patient if you have any.

## 2012-03-06 NOTE — Telephone Encounter (Signed)
Ok to give

## 2012-03-26 ENCOUNTER — Encounter: Payer: Self-pay | Admitting: Internal Medicine

## 2012-03-26 ENCOUNTER — Ambulatory Visit (INDEPENDENT_AMBULATORY_CARE_PROVIDER_SITE_OTHER): Payer: Medicare Other | Admitting: Internal Medicine

## 2012-03-26 VITALS — BP 132/82 | HR 77 | Temp 97.6°F | Resp 16 | Wt 136.8 lb

## 2012-03-26 DIAGNOSIS — R350 Frequency of micturition: Secondary | ICD-10-CM

## 2012-03-26 DIAGNOSIS — E1149 Type 2 diabetes mellitus with other diabetic neurological complication: Secondary | ICD-10-CM

## 2012-03-26 DIAGNOSIS — I5022 Chronic systolic (congestive) heart failure: Secondary | ICD-10-CM

## 2012-03-26 DIAGNOSIS — R4189 Other symptoms and signs involving cognitive functions and awareness: Secondary | ICD-10-CM

## 2012-03-26 DIAGNOSIS — R5381 Other malaise: Secondary | ICD-10-CM

## 2012-03-26 DIAGNOSIS — E559 Vitamin D deficiency, unspecified: Secondary | ICD-10-CM

## 2012-03-26 DIAGNOSIS — E119 Type 2 diabetes mellitus without complications: Secondary | ICD-10-CM

## 2012-03-26 DIAGNOSIS — I509 Heart failure, unspecified: Secondary | ICD-10-CM

## 2012-03-26 DIAGNOSIS — R351 Nocturia: Secondary | ICD-10-CM

## 2012-03-26 DIAGNOSIS — Z13220 Encounter for screening for lipoid disorders: Secondary | ICD-10-CM

## 2012-03-26 DIAGNOSIS — Z1211 Encounter for screening for malignant neoplasm of colon: Secondary | ICD-10-CM

## 2012-03-26 DIAGNOSIS — F09 Unspecified mental disorder due to known physiological condition: Secondary | ICD-10-CM

## 2012-03-26 LAB — POCT URINALYSIS DIPSTICK
Bilirubin, UA: NEGATIVE
Blood, UA: NEGATIVE
Glucose, UA: NEGATIVE
Nitrite, UA: NEGATIVE
Urobilinogen, UA: 1
pH, UA: 7

## 2012-03-26 LAB — MICROALBUMIN / CREATININE URINE RATIO: Microalb Creat Ratio: 2.2 mg/g (ref 0.0–30.0)

## 2012-03-26 NOTE — Progress Notes (Signed)
Patient ID: Kaitlin Edwards, female   DOB: [birth year], 79 y.o.   MRN: [id number]   Patient Active Problem List  Diagnosis  . Type 2 diabetes mellitus, controlled  . Glaucoma  . Hypertension  . Screening for breast cancer  . Osteoporosis, post-menopausal  . Anosmia  . Left bundle branch block  . Chronic systolic congestive heart failure  . Vascular dementia, uncomplicated  . Nocturia    Subjective:  CC:   Chief Complaint  Patient presents with  . Follow-up    HPI:   Kaitlin Musseris a 79 y.o. female who presents for follow up on chronic conditions including hypertension and type 2 diabetes. She has been taking her medications as prescribed but has noticed that since she has been taking  bystolic at night she has been having  nocturia x 4.  Her blood sugars have been well controlled by review of her morning fasting blood sugars which have been consistently less than 125. She is up-to-date on her eye exams. She has a history of macular degeneration and sees Dr. Carroll Sage. every 3 months and Dr. Inez Pilgrim for her annual retinal exam annually. She has no history of neuropathy that has noticed an occasional numb feeling on the lateral side of her right foot when she gets up in the middle of night to urinate. She does not notice is feeling during the day and does not describing any burning sensation.  She is exercising regularly using the [place] is indoor pool followed by a soak in a hot tub which relaxes her. She also does several aerobics classes that they offer .  She drinks plenty of water daily and use MiraLAX at night to chronic constipation.    Past Medical History  Diagnosis Date  . Diabetes mellitus     diet controlled  . Glaucoma(365)   . Hypertension     Past Surgical History  Procedure Laterality Date  . Cataract extraction         The following portions of the patient's history were reviewed and updated as appropriate: Allergies, current medications, and  problem list.    Review of Systems:   Patient denies headache, fevers, malaise, unintentional weight loss, skin rash, eye pain, sinus congestion and sinus pain, sore throat, dysphagia,  hemoptysis , cough, dyspnea, wheezing, chest pain, palpitations, orthopnea, edema, abdominal pain, nausea, melena, diarrhea, constipation, flank pain, dysuria, hematuria, urinary  Frequency, nocturia, numbness, tingling, seizures,  Focal weakness, Loss of consciousness,  Tremor, insomnia, depression, anxiety, and suicidal ideation.     History   Social History  . Marital Status: Married    Spouse Name: N/A    Number of Children: N/A  . Years of Education: N/A   Occupational History  . Not on file.   Social History Main Topics  . Smoking status: Never Smoker   . Smokeless tobacco: Never Used  . Alcohol Use: Yes     Comment: occasional-wine  . Drug Use: No  . Sexually Active: Not on file   Other Topics Concern  . Not on file   Social History Narrative  . No narrative on file    Objective:  BP 132/82  Pulse 77  Temp(Src) 97.6 F (36.4 C) (Oral)  Resp 16  Wt 136 lb 12 oz (62.029 kg)  BMI 23.46 kg/m2  SpO2 98%  General appearance: alert, cooperative and appears stated age Ears: normal TM's and external ear canals both ears Throat: lips, mucosa, and tongue normal; teeth and gums normal  Neck: no adenopathy, no carotid bruit, supple, symmetrical, trachea midline and thyroid not enlarged, symmetric, no tenderness/mass/nodules Back: symmetric, no curvature. ROM normal. No CVA tenderness. Lungs: clear to auscultation bilaterally Heart: regular rate and rhythm, S1, S2 normal, no murmur, click, rub or gallop Abdomen: soft, non-tender; bowel sounds normal; no masses,  no organomegaly Pulses: 2+ and symmetric Skin: Skin color, texture, turgor normal. No rashes or lesions Lymph nodes: Cervical, supraclavicular, and axillary nodes normal. Foot exam:  Nails are well trimmed,  No callouses,  pulses are 2+ and  Sensation intact to microfilament in 12 of 12 locations   Assessment and Plan:  Chronic systolic congestive heart failure Secondary to dilated cardiomyopathy by echocardiogram done May 2013, complicated by left bundle branch block. She remains well compensated on today's exam with no signs of edema. She has a previous intolerance to Coreg due to excessive sedation and was switched to bysystolic with good tolerance except for nocturnal frequent urination which is under investigation today.  Vascular dementia, uncomplicated  Nonprogressive, Patient lives at [facility] , has had no recent falls or lapses in medication.   Nocturia Self described by patient as a side effect of nocturnal use of Bystolic.  Urinalysis today shows trace leukocyte esterase. Urine culture is in progress. Recommended that patient start taking the by systolic in the morning. Samples given.  Type 2 diabetes mellitus, controlled Historically well controlled by serial hemoglobin A1cs. Repeat is due this month. She is up-to-date on eye exams. Foot exam today was normal. She's on the appropriate medications including aspirin and ARB.Marland Kitchen She is not on a statin due to her age. However we will check fasting lipids this month and if her LDL is significantly greater than 100 there may be compelling reasons to consider a trial of a low-dose statin given her concurrent vascular dementia.   Updated Medication List Outpatient Encounter Prescriptions as of 03/26/2012  Medication Sig Dispense Refill  . ACCU-CHEK SOFTCLIX LANCETS lancets 1 each by Other route daily. Use as instructed  100 each  3  . aspirin 81 MG tablet Take 81 mg by mouth daily.      . brinzolamide (AZOPT) 1 % ophthalmic suspension 1 drop 2 (two) times daily.        . Calcium Carbonate-Vitamin D (CALCIUM + D) 600-200 MG-UNIT TABS Take 1 tablet by mouth 2 (two) times daily.        . cyanocobalamin (,VITAMIN B-12,) 1000 MCG/ML injection Inject 1  mL (1,000 mcg total) into the muscle every 30 (thirty) days.  10 mL  3  . diphenhydramine-acetaminophen (TYLENOL PM) 25-500 MG TABS Take 1 tablet by mouth at bedtime as needed.      . fish oil-omega-3 fatty acids 1000 MG capsule Take 1,000 mg by mouth daily.       Marland Kitchen glipiZIDE (GLUCOTROL) 5 MG tablet Take 1 tablet in the am and 1/2 tablet in the pm.  135 tablet  3  . glucose blood (ACCU-CHEK AVIVA) test strip 1 each by other route 2 times daily. Use as instructed.  100 each  3  . Multiple Vitamins-Minerals (ICAPS PO) Take by mouth.        . nebivolol (BYSTOLIC) 5 MG tablet Take 5 mg by mouth daily.      . Psyllium (METAMUCIL PO) Take by mouth as needed.      . traMADol (ULTRAM) 50 MG tablet Take 1 tablet (50 mg total) by mouth every 6 (six) hours as needed.  90 tablet  3  . losartan (COZAAR) 100 MG tablet Take 1 tablet (100 mg total) by mouth daily.  90 tablet  3  . Simethicone (GAS RELIEF PO) Take by mouth. Takes 1-2 tablets prn       No facility-administered encounter medications on file as of 03/26/2012.     Orders Placed This Encounter  Procedures  . Urine culture  . Microalbumin / creatinine urine ratio  . POCT Urinalysis Dipstick    Return in about 3 months (around 06/23/2012).

## 2012-03-27 ENCOUNTER — Encounter: Payer: Self-pay | Admitting: Internal Medicine

## 2012-03-27 DIAGNOSIS — R351 Nocturia: Secondary | ICD-10-CM | POA: Insufficient documentation

## 2012-03-27 LAB — CBC AND DIFFERENTIAL
Hemoglobin: 13.2 g/dL (ref 12.0–16.0)
WBC: 7.3 10^3/mL

## 2012-03-27 NOTE — Assessment & Plan Note (Addendum)
Secondary to dilated cardiomyopathy by echocardiogram done May 2013, complicated by left bundle branch block. She remains well compensated on today's exam with no signs of edema. She has a previous intolerance to Coreg due to excessive sedation and was switched to bysystolic with good tolerance except for nocturnal frequent urination which is under investigation today.

## 2012-03-27 NOTE — Assessment & Plan Note (Signed)
Patient lives at [facility] , has had no recent falls or lapses in medication. The

## 2012-03-27 NOTE — Assessment & Plan Note (Signed)
Self described by patient as a side effect of nocturnal use of Bystolic.  Urinalysis today shows trace leukocyte esterase. Urine culture is in progress. Recommended that patient start taking the by systolic in the morning. Samples given.

## 2012-03-27 NOTE — Assessment & Plan Note (Addendum)
Historically well controlled by serial hemoglobin A1c is. Repeat is due this month. She is up-to-date on eye exams. Foot exam today was normal. She's on the appropriate medications including aspirin and ARB.Marland Kitchen She is not on a statin due to her age. However we will check fasting lipids this month and if her LDL is significantly greater than 100 there may be compelling reasons to consider a trial of a low-dose statin.

## 2012-03-28 LAB — URINE CULTURE: Colony Count: NO GROWTH

## 2012-04-02 ENCOUNTER — Telehealth: Payer: Self-pay | Admitting: Internal Medicine

## 2012-04-02 NOTE — Telephone Encounter (Signed)
Pt.notified

## 2012-04-02 NOTE — Telephone Encounter (Signed)
All labs normal,. Including thyroid function  Diabetes control  and cholesterol

## 2012-04-17 ENCOUNTER — Encounter: Payer: Self-pay | Admitting: Internal Medicine

## 2012-05-27 ENCOUNTER — Telehealth: Payer: Self-pay | Admitting: Internal Medicine

## 2012-05-27 NOTE — Telephone Encounter (Signed)
Please advise pt we do not have samples of bystolic.

## 2012-05-27 NOTE — Telephone Encounter (Signed)
Patient calling wanting more Bystolic 5 mg samples

## 2012-05-28 MED ORDER — NEBIVOLOL HCL 5 MG PO TABS: 5.0000 mg | ORAL_TABLET | Freq: Every day | ORAL | Status: DC

## 2012-05-28 NOTE — Telephone Encounter (Signed)
Does patient need script since we don't have sample's of Bystolic.

## 2012-05-28 NOTE — Telephone Encounter (Signed)
Yes, i have [facility]

## 2012-07-26 ENCOUNTER — Encounter: Payer: Self-pay | Admitting: Cardiovascular Disease

## 2012-07-26 ENCOUNTER — Ambulatory Visit (INDEPENDENT_AMBULATORY_CARE_PROVIDER_SITE_OTHER): Payer: Medicare Other | Admitting: Cardiovascular Disease

## 2012-07-26 VITALS — BP 142/72 | HR 70 | Wt 132.5 lb

## 2012-07-26 DIAGNOSIS — I10 Essential (primary) hypertension: Secondary | ICD-10-CM

## 2012-07-26 DIAGNOSIS — I5022 Chronic systolic (congestive) heart failure: Secondary | ICD-10-CM

## 2012-07-26 DIAGNOSIS — I509 Heart failure, unspecified: Secondary | ICD-10-CM

## 2012-07-26 DIAGNOSIS — I447 Left bundle-branch block, unspecified: Secondary | ICD-10-CM

## 2012-07-26 MED ORDER — NEBIVOLOL HCL 5 MG PO TABS: 5.0000 mg | ORAL_TABLET | Freq: Every day | ORAL | Status: DC

## 2012-07-26 NOTE — Progress Notes (Signed)
HPI  This is a 79 y.o. pleasant female who is here today for a followup visit regarding cardiomyopathy and LBBB . She was admitted to [facility] in May of 2013 for accelerated hypertension with epistaxis She had an echocardiogram done which showed an ejection fraction of 40% with septal motion abnormalities suggestive of conduction disease as well as septal and distal anterior hypokinesis with evidence of moderate pulmonary hypertension.  She was treated with metoprolol but did not tolerate it due to fatigue. She also did not tolerate carvedilol well for the same reason. She has been tolerating Bystolic. Overall, she has been doing well with no chest pain or dyspnea. Her home blood pressure readings seem to be mostly optimal. She continues to exercise regularly without significant limitations.  No Known Allergies   Current Outpatient Prescriptions on File Prior to Visit  Medication Sig Dispense Refill  . ACCU-CHEK SOFTCLIX LANCETS lancets 1 each by Other route daily. Use as instructed  100 each  3  . aspirin 81 MG tablet Take 81 mg by mouth daily.      . brinzolamide (AZOPT) 1 % ophthalmic suspension 1 drop 2 (two) times daily.        . Calcium Carbonate-Vitamin D (CALCIUM + D) 600-200 MG-UNIT TABS Take 0.5 tablets by mouth daily.       . cyanocobalamin (,VITAMIN B-12,) 1000 MCG/ML injection Inject 1 mL (1,000 mcg total) into the muscle every 30 (thirty) days.  10 mL  3  . diphenhydramine-acetaminophen (TYLENOL PM) 25-500 MG TABS Take 1 tablet by mouth at bedtime as needed.      . fish oil-omega-3 fatty acids 1000 MG capsule Take 1,000 mg by mouth daily.       Marland Kitchen glipiZIDE (GLUCOTROL) 5 MG tablet Take 1 tablet in the am and 1/2 tablet in the pm.  135 tablet  3  . glucose blood (ACCU-CHEK AVIVA) test strip 1 each by other route 2 times daily. Use as instructed.  100 each  3  . losartan (COZAAR) 100 MG tablet Take 1 tablet (100 mg total) by mouth daily.  90 tablet  3  . Multiple Vitamins-Minerals  (ICAPS PO) Take by mouth.        . Psyllium (METAMUCIL PO) Take by mouth as needed.      . Simethicone (GAS RELIEF PO) Take by mouth. Takes 1-2 tablets prn      . traMADol (ULTRAM) 50 MG tablet Take 1 tablet (50 mg total) by mouth every 6 (six) hours as needed.  90 tablet  3   No current facility-administered medications on file prior to visit.     Past Medical History  Diagnosis Date  . Diabetes mellitus     diet controlled  . Glaucoma   . Hypertension      Past Surgical History  Procedure Laterality Date  . Cataract extraction       Family History  Problem Relation Age of Onset  . Parkinsonism Mother   . Hypertension Brother      History   Social History  . Marital Status: Married    Spouse Name: N/A    Number of Children: N/A  . Years of Education: N/A   Occupational History  . Not on file.   Social History Main Topics  . Smoking status: Never Smoker   . Smokeless tobacco: Never Used  . Alcohol Use: Yes     Comment: occasional-wine  . Drug Use: No  . Sexually Active: Not on file  Other Topics Concern  . Not on file   Social History Narrative  . No narrative on file      PHYSICAL EXAM   BP 142/72  Pulse 70  Wt 132 lb 8 oz (60.102 kg)  BMI 22.73 kg/m2 Constitutional: She is oriented to person, place, and time. She appears well-developed and well-nourished. No distress.  HENT: No nasal discharge.  Head: Normocephalic and atraumatic.  Eyes: Pupils are equal and round. Right eye exhibits no discharge. Left eye exhibits no discharge.  Neck: Normal range of motion. Neck supple. No JVD present. No thyromegaly present.  Cardiovascular: Normal rate, regular rhythm, normal heart sounds. Exam reveals no gallop and no friction rub. No murmur heard.  Pulmonary/Chest: Effort normal and breath sounds normal. No stridor. No respiratory distress. She has no wheezes. She has no rales. She exhibits no tenderness.  Abdominal: Soft. Bowel sounds are normal. She  exhibits no distension. There is no tenderness. There is no rebound and no guarding.  Musculoskeletal: Normal range of motion. She exhibits no edema and no tenderness.  Neurological: She is alert and oriented to person, place, and time. Coordination normal.  Skin: Skin is warm and dry. No rash noted. She is not diaphoretic. No erythema. No pallor.  Psychiatric: She has a normal mood and affect. Her behavior is normal. Judgment and thought content normal.     EKG: Sinus  Rhythm  -First degree A-V block  PRi = 254 -Left bundle branch block and left axis.   ABNORMAL     ASSESSMENT AND PLAN

## 2012-07-26 NOTE — Patient Instructions (Addendum)
Continue same medications  Follow up in 1 year

## 2012-07-26 NOTE — Assessment & Plan Note (Signed)
The patient has cardiomyopathy with an ejection fraction of 40% in the setting of uncontrolled hypertension likely due to hypertensive heart disease. She is currently New York Heart Association class II and appears to be euvolemic. Due to her age, lack of significant symptoms related to this, no ischemic cardiac evaluation was pursued. Continue treatment with losartan and Bystolic.  Followup in one year.

## 2012-07-26 NOTE — Assessment & Plan Note (Signed)
Blood pressure overall seems to be well-controlled.

## 2012-08-21 ENCOUNTER — Encounter: Payer: Self-pay | Admitting: Internal Medicine

## 2012-08-21 ENCOUNTER — Ambulatory Visit (INDEPENDENT_AMBULATORY_CARE_PROVIDER_SITE_OTHER): Payer: Medicare Other | Admitting: Internal Medicine

## 2012-08-21 VITALS — BP 162/82 | HR 85 | Temp 98.1°F | Resp 14 | Wt 131.5 lb

## 2012-08-21 DIAGNOSIS — I10 Essential (primary) hypertension: Secondary | ICD-10-CM

## 2012-08-21 DIAGNOSIS — E119 Type 2 diabetes mellitus without complications: Secondary | ICD-10-CM

## 2012-08-21 DIAGNOSIS — R5381 Other malaise: Secondary | ICD-10-CM

## 2012-08-21 DIAGNOSIS — I5022 Chronic systolic (congestive) heart failure: Secondary | ICD-10-CM

## 2012-08-21 DIAGNOSIS — I509 Heart failure, unspecified: Secondary | ICD-10-CM

## 2012-08-21 LAB — COMPREHENSIVE METABOLIC PANEL
ALT: 17 U/L (ref 0–35)
Albumin: 4.2 g/dL (ref 3.5–5.2)
CO2: 28 mEq/L (ref 19–32)
Calcium: 9.4 mg/dL (ref 8.4–10.5)
Chloride: 104 mEq/L (ref 96–112)
GFR: 77.24 mL/min (ref >60.00)
Sodium: 139 mEq/L (ref 135–145)
Total Protein: 7.5 g/dL (ref 6.0–8.3)

## 2012-08-21 LAB — MICROALBUMIN / CREATININE URINE RATIO: Microalb Creat Ratio: 1.5 mg/g (ref 0.0–30.0)

## 2012-08-21 NOTE — Patient Instructions (Addendum)
Your blood pressure and diabetes appear to be well controlled   Try omitting the tylenol PM for a few nights to see if it is helping your sleep (the benadryl is causing the pharmacist to worry )   The only nonsedating medication for insomnia is melatonin 5 or 6 mg  which you can buy OTC (over the counter)   If the melatonin is not effective.,  Resume the tylenol PM   Gas is often due to vegetables.  You can try Gas X or Mylanta Gas to treat gas or gas with sour stomach   You can take the bystolic after dinner . It may help your fatigue improve    You are also taking losartan for blood pressure   You can substitute tylenol for tramadol or a day to see if the tramadol is making you tired

## 2012-08-21 NOTE — Progress Notes (Signed)
Patient ID: Kaitlin Edwards, female   DOB: Sep 12, 1920, 79 y.o.   MRN: [id number]  Patient Active Problem List   Diagnosis Date Noted  . Nocturia 03/27/2012  . Vascular dementia, uncomplicated 11/06/2011  . Chronic systolic congestive heart failure 07/24/2011  . Left bundle branch block 07/23/2011  . Anosmia 02/05/2011  . Screening for breast cancer 11/05/2010  . Osteoporosis, post-menopausal 11/05/2010  . Type 2 diabetes mellitus, controlled   . Glaucoma   . Hypertension     Subjective:  CC:   Chief Complaint  Patient presents with  . Follow-up    5 months    HPI:   Kaitlin Musseris a 79 y.o. female who presents for 3 month follow up on diabetes mellitus ,  Hypertension and cardiomyopathy.  Her blood sugars have been consistently at goal in the morning fasting.  Blood pressures also normal.  No lows.  She is compliant with medications , except she is endorsing fatigue.  No fluid retention , dyspnea or chest pain.    Past Medical History  Diagnosis Date  . Diabetes mellitus     diet controlled  . Glaucoma   . Hypertension     Past Surgical History  Procedure Laterality Date  . Cataract extraction         The following portions of the patient's history were reviewed and updated as appropriate: Allergies, current medications, and problem list.    Review of Systems:   12 Pt  review of systems was negative except those addressed in the HPI,     History   Social History  . Marital Status: Married    Spouse Name: N/A    Number of Children: N/A  . Years of Education: N/A   Occupational History  . Not on file.   Social History Main Topics  . Smoking status: Never Smoker   . Smokeless tobacco: Never Used  . Alcohol Use: Yes     Comment: occasional-wine  . Drug Use: No  . Sexually Active: Not on file   Other Topics Concern  . Not on file   Social History Narrative  . No narrative on file    Objective:  BP 162/82  Pulse 85  Temp(Src) 98.1 F  (36.7 C) (Oral)  Resp 14  Wt 131 lb 8 oz (59.648 kg)  BMI 22.56 kg/m2  SpO2 98%  General appearance: alert, cooperative and appears stated age Ears: normal TM's and external ear canals both ears Throat: lips, mucosa, and tongue normal; teeth and gums normal Neck: no adenopathy, no carotid bruit, supple, symmetrical, trachea midline and thyroid not enlarged, symmetric, no tenderness/mass/nodules Back: symmetric, no curvature. ROM normal. No CVA tenderness. Lungs: clear to auscultation bilaterally Heart: regular rate and rhythm, S1, S2 normal, no murmur, click, rub or gallop Abdomen: soft, non-tender; bowel sounds normal; no masses,  no organomegaly Pulses: 2+ and symmetric Skin: Skin color, texture, turgor normal. No rashes or lesions Lymph nodes: Cervical, supraclavicular, and axillary nodes normal. Foot exam:  Nails are well trimmed,  No callouses,  Sensation intact to microfilament  Assessment and Plan:  Type 2 diabetes mellitus, controlled Well-controlled on current medications.  hemoglobin A1c has been consistently less than 7.0 . She is up-to-date on eye exams and her foot exam is normal. l we'll repeat his urine microalbumin to creatinine ratio at next visit. She is on the appropriate medications.  Hypertension Well controlled on current regimen. Renal function stable, no changes today.  Other malaise and fatigue Discussed suspending tylenol  PM and tramadol to see if her fatigue improves.    Updated Medication List Outpatient Encounter Prescriptions as of 08/21/2012  Medication Sig Dispense Refill  . ACCU-CHEK SOFTCLIX LANCETS lancets 1 each by Other route daily. Use as instructed  100 each  3  . aspirin 81 MG tablet Take 81 mg by mouth daily.      . brinzolamide (AZOPT) 1 % ophthalmic suspension 1 drop 2 (two) times daily.        . Calcium Carbonate-Vitamin D (CALCIUM + D) 600-200 MG-UNIT TABS Take 0.5 tablets by mouth daily.       . cyanocobalamin (,VITAMIN B-12,) 1000  MCG/ML injection Inject 1 mL (1,000 mcg total) into the muscle every 30 (thirty) days.  10 mL  3  . diphenhydramine-acetaminophen (TYLENOL PM) 25-500 MG TABS Take 1 tablet by mouth at bedtime as needed.      . fish oil-omega-3 fatty acids 1000 MG capsule Take 1,000 mg by mouth daily.       Marland Kitchen glipiZIDE (GLUCOTROL) 5 MG tablet Take 1 tablet in the am and 1/2 tablet in the pm.  135 tablet  3  . glucose blood (ACCU-CHEK AVIVA) test strip 1 each by other route 2 times daily. Use as instructed.  100 each  3  . losartan (COZAAR) 100 MG tablet Take 1 tablet (100 mg total) by mouth daily.  90 tablet  3  . Multiple Vitamins-Minerals (ICAPS PO) Take by mouth.        . nebivolol (BYSTOLIC) 5 MG tablet Take 1 tablet (5 mg total) by mouth daily.  90 tablet  3  . Psyllium (METAMUCIL PO) Take by mouth as needed.      . Simethicone (GAS RELIEF PO) Take by mouth. Takes 1-2 tablets prn      . traMADol (ULTRAM) 50 MG tablet Take 1 tablet (50 mg total) by mouth every 6 (six) hours as needed.  90 tablet  3   No facility-administered encounter medications on file as of 08/21/2012.

## 2012-08-22 ENCOUNTER — Encounter: Payer: Self-pay | Admitting: *Deleted

## 2012-08-22 ENCOUNTER — Encounter: Payer: Self-pay | Admitting: Internal Medicine

## 2012-08-22 DIAGNOSIS — R5383 Other fatigue: Secondary | ICD-10-CM | POA: Insufficient documentation

## 2012-08-22 NOTE — Assessment & Plan Note (Signed)
Well-controlled on current medications.  hemoglobin A1c has been consistently less than 7.0 . She is up-to-date on eye exams and her foot exam is normal. l we'll repeat his urine microalbumin to creatinine ratio at next visit. She is on the appropriate medications.

## 2012-08-22 NOTE — Assessment & Plan Note (Signed)
Discussed suspending tylenol PM and tramadol to see if her fatigue improves.

## 2012-08-22 NOTE — Assessment & Plan Note (Signed)
Well controlled on current regimen. Renal function stable, no changes today. 

## 2012-11-20 ENCOUNTER — Telehealth: Payer: Self-pay | Admitting: Internal Medicine

## 2012-11-20 NOTE — Telephone Encounter (Signed)
Pt needing Tramadol refill.   Pt states this should come through her mail order.  Advised pt she may need to come to office for pick up.  Please advise pt of protocol to refill Tramadol.

## 2012-11-21 MED ORDER — TRAMADOL HCL 50 MG PO TABS
50.0000 mg | ORAL_TABLET | Freq: Four times a day (QID) | ORAL | Status: DC | PRN
Start: 2012-11-21 — End: 2013-02-17

## 2012-11-21 NOTE — Telephone Encounter (Signed)
Ok to refill,  Patient was seen in July , I wil print out order for faxing to mail order

## 2012-11-21 NOTE — Telephone Encounter (Signed)
Script sent to mail order pharmacy as requested.

## 2012-11-21 NOTE — Telephone Encounter (Signed)
OK to refill

## 2012-12-16 ENCOUNTER — Encounter: Payer: Self-pay | Admitting: *Deleted

## 2012-12-17 ENCOUNTER — Ambulatory Visit (INDEPENDENT_AMBULATORY_CARE_PROVIDER_SITE_OTHER): Payer: Medicare Other | Admitting: Internal Medicine

## 2012-12-17 ENCOUNTER — Encounter: Payer: Self-pay | Admitting: Internal Medicine

## 2012-12-17 VITALS — BP 150/80 | HR 78 | Temp 98.5°F | Ht 64.0 in | Wt 131.2 lb

## 2012-12-17 DIAGNOSIS — E119 Type 2 diabetes mellitus without complications: Secondary | ICD-10-CM

## 2012-12-17 DIAGNOSIS — I509 Heart failure, unspecified: Secondary | ICD-10-CM

## 2012-12-17 DIAGNOSIS — I10 Essential (primary) hypertension: Secondary | ICD-10-CM

## 2012-12-17 DIAGNOSIS — E785 Hyperlipidemia, unspecified: Secondary | ICD-10-CM

## 2012-12-17 DIAGNOSIS — F0150 Vascular dementia without behavioral disturbance: Secondary | ICD-10-CM

## 2012-12-17 DIAGNOSIS — R351 Nocturia: Secondary | ICD-10-CM

## 2012-12-17 DIAGNOSIS — I5022 Chronic systolic (congestive) heart failure: Secondary | ICD-10-CM

## 2012-12-17 DIAGNOSIS — R5381 Other malaise: Secondary | ICD-10-CM

## 2012-12-17 NOTE — Progress Notes (Signed)
Pre-visit discussion using our clinic review tool. No additional management support is needed unless otherwise documented below in the visit note.  

## 2012-12-17 NOTE — Patient Instructions (Addendum)
Stop taking the tramadol.  Save it for severe pain .    You can use acetaminiphen (tylenol) or  Ibuprofen (Advil and Motrin ) as needed for pain lasting longer than 30 minutes    Have a bit of cheese or yogurt before bedtime ,  This will prevent low blood sugars in the morning   Your blood sugar should not fall below 80.  This is too low.   You should not have to urinate more than twice during the night.   We will check your urine for signs of infection. If it is normal I can prescribe something to make your ;bladder less active.   [employer] is a good choice for your insurance change.   I may be able  to change your bystolic once I see your more recent readings .

## 2012-12-18 ENCOUNTER — Encounter: Payer: Self-pay | Admitting: Internal Medicine

## 2012-12-18 DIAGNOSIS — E1169 Type 2 diabetes mellitus with other specified complication: Secondary | ICD-10-CM | POA: Insufficient documentation

## 2012-12-18 LAB — COMPREHENSIVE METABOLIC PANEL
ALT: 29 U/L (ref 0–35)
AST: 23 U/L (ref 0–37)
Albumin: 4.2 g/dL (ref 3.5–5.2)
CO2: 24 mEq/L (ref 19–32)
Calcium: 9.3 mg/dL (ref 8.4–10.5)
Chloride: 101 mEq/L (ref 96–112)
Creatinine, Ser: 0.9 mg/dL (ref 0.4–1.2)
GFR: 65.91 mL/min (ref >60.00)
Sodium: 134 mEq/L — ABNORMAL LOW (ref 135–145)
Total Protein: 7.1 g/dL (ref 6.0–8.3)

## 2012-12-18 LAB — LDL CHOLESTEROL, DIRECT: Direct LDL: 134.4 mg/dL

## 2012-12-18 LAB — HEMOGLOBIN A1C: Hgb A1c MFr Bld: 6.6 % — ABNORMAL HIGH (ref 4.6–6.5)

## 2012-12-18 LAB — MICROALBUMIN / CREATININE URINE RATIO: Microalb, Ur: 1.3 mg/dL (ref 0.0–1.9)

## 2012-12-18 NOTE — Assessment & Plan Note (Signed)
Stopping tramadol since she has no pain issues. Kaitlin Edwards

## 2012-12-18 NOTE — Assessment & Plan Note (Signed)
Fasting lipids were done in February and LDL was 135 currently.  Given her extreme age, statins have not been prescribed    

## 2012-12-18 NOTE — Progress Notes (Signed)
Patient ID: Kaitlin Edwards, female   DOB: August 18, 1920, 79 y.o.   MRN: [id number]  Patient Active Problem List   Diagnosis Date Noted  . Other malaise and fatigue 08/22/2012  . Nocturia 03/27/2012  . Vascular dementia, uncomplicated 11/06/2011  . Chronic systolic congestive heart failure 07/24/2011  . Left bundle branch block 07/23/2011  . Anosmia 02/05/2011  . Screening for breast cancer 11/05/2010  . Osteoporosis, post-menopausal 11/05/2010  . Type 2 diabetes mellitus, controlled   . Glaucoma   . Hypertension     Subjective:  CC:   Chief Complaint  Patient presents with  . Annual Exam    Medicare Wellness    HPI:   Kaitlin Musseris a 79 y.o. female who presents for follow up on diabetes mellitus, hypertension, milc cognitive dysfunction and fatigue.   Fasting blood sugars log brought in and are all under  130 Except for 3 lows for 69 to 75.  Bos have been normal.  Not sure why she is taking tramadol, has been taking it foe over one year.  Also needs alternative ot bystolic due to insurance change.   Past Medical History  Diagnosis Date  . Diabetes mellitus     diet controlled  . Glaucoma   . Hypertension     Past Surgical History  Procedure Laterality Date  . Cataract extraction         The following portions of the patient's history were reviewed and updated as appropriate: Allergies, current medications, and problem list.    Review of Systems:   12 Pt  review of systems was negative except those addressed in the HPI,     History   Social History  . Marital Status: Married    Spouse Name: N/A    Number of Children: N/A  . Years of Education: N/A   Occupational History  . Not on file.   Social History Main Topics  . Smoking status: Never Smoker   . Smokeless tobacco: Never Used  . Alcohol Use: Yes     Comment: occasional-wine  . Drug Use: No  . Sexual Activity: Not on file   Other Topics Concern  . Not on file   Social History Narrative   . No narrative on file    Objective:  Filed Vitals:   12/17/12 1330  BP: 150/80  Pulse: 78  Temp: 98.5 F (36.9 C)     General appearance: alert, cooperative and appears stated age Ears: normal TM's and external ear canals both ears Throat: lips, mucosa, and tongue normal; teeth and gums normal Neck: no adenopathy, no carotid bruit, supple, symmetrical, trachea midline and thyroid not enlarged, symmetric, no tenderness/mass/nodules Back: symmetric, no curvature. ROM normal. No CVA tenderness. Lungs: clear to auscultation bilaterally Heart: regular rate and rhythm, S1, S2 normal, no murmur, click, rub or gallop Abdomen: soft, non-tender; bowel sounds normal; no masses,  no organomegaly Pulses: 2+ and symmetric Skin: Skin color, texture, turgor normal. No rashes or lesions Lymph nodes: Cervical, supraclavicular, and axillary nodes normal. Foot exam:  Nails are well trimmed,  No callouses,  Sensation intact to microfilament  Assessment and Plan:  Chronic systolic congestive heart failure Will change bystolic to carvedilol for cost savings.  No signs of fluid overload on exam   Other malaise and fatigue Stopping tramadol since she has no pain issues. .  Type 2 diabetes mellitus, controlled Well-controlled on current medications.  hemoglobin A1c has been consistently less than 7.0  She is up-to-date on eye exams  and his foot exam is normal.  we'll repeat her urine microalbumin to creatinine ratio at next visit.s He is on the appropriate medications. Lab Results  Component Value Date   HGBA1C 6.6* 12/17/2012   Lab Results  Component Value Date   MICROALBUR 1.3 12/17/2012    Vascular dementia, uncomplicated She remains competent to self administer medications and to live independently.   Hypertension Well controlled on current regimen. Renal function stable, no changes today.  Lab Results  Component Value Date   CREATININE 0.9 12/17/2012    Other and unspecified  hyperlipidemia Fasting lipids were done in February and LDL was 135 currently.  Given her extreme age, statins have not been prescribed   A total of 30 minutes was spent with patient more than half of which was spent in counseling, reviewing records from other prviders and coordination of care.  Updated Medication List Outpatient Encounter Prescriptions as of 12/17/2012  Medication Sig  . ACCU-CHEK SOFTCLIX LANCETS lancets 1 each by Other route daily. Use as instructed  . aspirin 81 MG tablet Take 81 mg by mouth daily.  . brinzolamide (AZOPT) 1 % ophthalmic suspension 1 drop 2 (two) times daily.    . cyanocobalamin (,VITAMIN B-12,) 1000 MCG/ML injection Inject 1 mL (1,000 mcg total) into the muscle every 30 (thirty) days.  . diphenhydramine-acetaminophen (TYLENOL PM) 25-500 MG TABS Take 1 tablet by mouth at bedtime as needed.  . fish oil-omega-3 fatty acids 1000 MG capsule Take 1,000 mg by mouth daily.   Marland Kitchen glipiZIDE (GLUCOTROL) 5 MG tablet Take 1 tablet in the am and 1/2 tablet in the pm.  . glucose blood (ACCU-CHEK AVIVA) test strip 1 each by other route 2 times daily. Use as instructed.  Marland Kitchen losartan (COZAAR) 100 MG tablet Take 1 tablet (100 mg total) by mouth daily.  . Multiple Vitamins-Minerals (ICAPS PO) Take by mouth.    . nebivolol (BYSTOLIC) 5 MG tablet Take 1 tablet (5 mg total) by mouth daily.  . Psyllium (METAMUCIL PO) Take by mouth as needed.  . Simethicone (GAS RELIEF PO) Take by mouth. Takes 1-2 tablets prn  . traMADol (ULTRAM) 50 MG tablet Take 1 tablet (50 mg total) by mouth every 6 (six) hours as needed.  . Calcium Carbonate-Vitamin D (CALCIUM + D) 600-200 MG-UNIT TABS Take 0.5 tablets by mouth daily.

## 2012-12-18 NOTE — Assessment & Plan Note (Addendum)
Well-controlled on current medications.  hemoglobin A1c has been consistently less than 7.0  She is up-to-date on eye exams and his foot exam is normal.  we'll repeat her urine microalbumin to creatinine ratio at next visit.s He is on the appropriate medications.  Lab Results  Component Value Date   HGBA1C 6.6* 12/17/2012   Lab Results  Component Value Date   MICROALBUR 1.3 12/17/2012     

## 2012-12-18 NOTE — Assessment & Plan Note (Signed)
Will change bystolic to carvedilol for cost savings.  No signs of fluid overload on exam

## 2012-12-18 NOTE — Assessment & Plan Note (Addendum)
Well controlled on current regimen. Renal function stable, no changes today. But her new insurance will necessitate a switch from bystolic to coreg.   Lab Results  Component Value Date   CREATININE 0.9 12/17/2012

## 2012-12-18 NOTE — Assessment & Plan Note (Signed)
She remains competent to self administer medications and to live independently.

## 2012-12-19 ENCOUNTER — Encounter: Payer: Self-pay | Admitting: *Deleted

## 2012-12-24 ENCOUNTER — Other Ambulatory Visit: Payer: Self-pay | Admitting: *Deleted

## 2012-12-24 MED ORDER — GLIPIZIDE 5 MG PO TABS: ORAL_TABLET | ORAL | Status: DC

## 2012-12-25 ENCOUNTER — Telehealth: Payer: Self-pay | Admitting: Emergency Medicine

## 2012-12-25 MED ORDER — LOSARTAN POTASSIUM 100 MG PO TABS: 100.0000 mg | ORAL_TABLET | Freq: Every day | ORAL | Status: DC

## 2012-12-25 NOTE — Telephone Encounter (Signed)
Patient said she called pharmacy to refill losartan (COZAAR) 100 MG tablet. They told her to contact us as she did not have any refills. Please advise.

## 2012-12-25 NOTE — Telephone Encounter (Signed)
Pt needed it to [facility]. Rx sent to pharmacy by escript. Pt notified.

## 2012-12-26 ENCOUNTER — Telehealth: Payer: Self-pay | Admitting: Internal Medicine

## 2012-12-26 NOTE — Telephone Encounter (Signed)
Losartan rx has been ordered through pt mail order pharmacy.  She only has #1 pill left and needs a few to get her through until mail order received, which should be the end of next week.  Do we have any samples or can a few be called in.  [facility] if we do not have samples.  Please advise pt.

## 2012-12-26 NOTE — Telephone Encounter (Signed)
Line busy, will try back 

## 2012-12-26 NOTE — Telephone Encounter (Signed)
Line continues to be busy. 

## 2012-12-27 MED ORDER — LOSARTAN POTASSIUM 100 MG PO TABS: 100.0000 mg | ORAL_TABLET | Freq: Every day | ORAL | Status: DC

## 2012-12-27 NOTE — Telephone Encounter (Signed)
Rx sent to pharmacy by escript. No samples of Losartan in office. Left message for pt

## 2013-02-17 ENCOUNTER — Other Ambulatory Visit: Payer: Self-pay | Admitting: *Deleted

## 2013-02-17 MED ORDER — NEBIVOLOL HCL 5 MG PO TABS: 5.0000 mg | ORAL_TABLET | Freq: Every day | ORAL | Status: DC

## 2013-02-17 NOTE — Telephone Encounter (Signed)
Requested Prescriptions   Signed Prescriptions Disp Refills  . nebivolol (BYSTOLIC) 5 MG tablet 90 tablet 3    Sig: Take 1 tablet (5 mg total) by mouth daily.    Authorizing Provider: Kathlyn Sacramento A    Ordering User: Britt Bottom

## 2013-02-18 MED ORDER — LOSARTAN POTASSIUM 100 MG PO TABS: 100.0000 mg | ORAL_TABLET | Freq: Every day | ORAL | Status: DC

## 2013-02-18 MED ORDER — TRAMADOL HCL 50 MG PO TABS: 50.0000 mg | ORAL_TABLET | Freq: Four times a day (QID) | ORAL | Status: DC | PRN

## 2013-02-18 MED ORDER — GLIPIZIDE 5 MG PO TABS
ORAL_TABLET | ORAL | Status: DC
Start: 2013-02-17 — End: 2013-09-01

## 2013-02-18 NOTE — Telephone Encounter (Signed)
Ok to refill,  Refills sent  

## 2013-02-24 ENCOUNTER — Other Ambulatory Visit: Payer: Self-pay | Admitting: *Deleted

## 2013-02-24 ENCOUNTER — Other Ambulatory Visit: Payer: Self-pay

## 2013-02-24 MED ORDER — NEBIVOLOL HCL 5 MG PO TABS: 5.0000 mg | ORAL_TABLET | Freq: Every day | ORAL | Status: DC

## 2013-02-24 NOTE — Telephone Encounter (Signed)
Requested Prescriptions   Signed Prescriptions Disp Refills  . nebivolol (BYSTOLIC) 5 MG tablet 90 tablet 3    Sig: Take 1 tablet (5 mg total) by mouth daily.    Authorizing Provider: Minna Merritts    Ordering User: Britt Bottom

## 2013-02-24 NOTE — Telephone Encounter (Signed)
Placed samples upfront for bystolic 5 mg.

## 2013-02-24 NOTE — Telephone Encounter (Signed)
Wants some samples as well. Please call when ready. #[phone]

## 2013-04-23 ENCOUNTER — Encounter (INDEPENDENT_AMBULATORY_CARE_PROVIDER_SITE_OTHER): Payer: Self-pay

## 2013-04-23 ENCOUNTER — Ambulatory Visit (INDEPENDENT_AMBULATORY_CARE_PROVIDER_SITE_OTHER): Payer: Commercial Managed Care - HMO | Admitting: Internal Medicine

## 2013-04-23 ENCOUNTER — Encounter: Payer: Self-pay | Admitting: Internal Medicine

## 2013-04-23 VITALS — BP 176/80 | HR 72 | Temp 97.9°F | Resp 16 | Wt 127.8 lb

## 2013-04-23 DIAGNOSIS — E119 Type 2 diabetes mellitus without complications: Secondary | ICD-10-CM

## 2013-04-23 DIAGNOSIS — E785 Hyperlipidemia, unspecified: Secondary | ICD-10-CM

## 2013-04-23 DIAGNOSIS — I10 Essential (primary) hypertension: Secondary | ICD-10-CM

## 2013-04-23 DIAGNOSIS — Z23 Encounter for immunization: Secondary | ICD-10-CM

## 2013-04-23 DIAGNOSIS — F0150 Vascular dementia without behavioral disturbance: Secondary | ICD-10-CM

## 2013-04-23 DIAGNOSIS — H409 Unspecified glaucoma: Secondary | ICD-10-CM

## 2013-04-23 MED ORDER — NEBIVOLOL HCL 5 MG PO TABS: 5.0000 mg | ORAL_TABLET | Freq: Every day | ORAL | Status: DC

## 2013-04-23 NOTE — Patient Instructions (Addendum)
Ask the [facility] Nurse to check your blood pressure 3 times over the next week or so  And send the readings to me along with your pulse.  You may want to try chewable calcium  ,  Called Viactiv ,  And take one daily .   Try to get 600 mg of calcium in your diet   Ask the nurse if you have had your tetanus shot.  You received the new pneumonia vaccine

## 2013-04-23 NOTE — Progress Notes (Signed)
Patient ID: Kaitlin Edwards, female   DOB: December 23, 1920, 80 y.o.   MRN: [id number]   Patient Active Problem List   Diagnosis Date Noted  . Other and unspecified hyperlipidemia 12/18/2012  . Other malaise and fatigue 08/22/2012  . Nocturia 03/27/2012  . Vascular dementia, uncomplicated 81/02/7508  . Chronic systolic congestive heart failure 07/24/2011  . Left bundle branch block 07/23/2011  . Anosmia 02/05/2011  . Screening for breast cancer 11/05/2010  . Osteoporosis, post-menopausal 11/05/2010  . Type 2 diabetes mellitus, controlled   . Glaucoma   . Hypertension     Subjective:  CC:   Chief Complaint  Patient presents with  . Follow-up  . Diabetes    HPI:   Kaitlin Edwards is a 80 y.o. female who presents for   Past Medical History  Diagnosis Date  . Diabetes mellitus     diet controlled  . Glaucoma   . Hypertension     Past Surgical History  Procedure Laterality Date  . Cataract extraction         The following portions of the patient's history were reviewed and updated as appropriate: Allergies, current medications, and problem list.    Review of Systems:   Patient denies headache, fevers, malaise, unintentional weight loss, skin rash, eye pain, sinus congestion and sinus pain, sore throat, dysphagia,  hemoptysis , cough, dyspnea, wheezing, chest pain, palpitations, orthopnea, edema, abdominal pain, nausea, melena, diarrhea, constipation, flank pain, dysuria, hematuria, urinary  Frequency, nocturia, numbness, tingling, seizures,  Focal weakness, Loss of consciousness,  Tremor, insomnia, depression, anxiety, and suicidal ideation.     History   Social History  . Marital Status: Married    Spouse Name: N/A    Number of Children: N/A  . Years of Education: N/A   Occupational History  . Not on file.   Social History Main Topics  . Smoking status: Never Smoker   . Smokeless tobacco: Never Used  . Alcohol Use: Yes     Comment: occasional-wine  . Drug  Use: No  . Sexual Activity: Not on file   Other Topics Concern  . Not on file   Social History Narrative  . No narrative on file    Objective:  Filed Vitals:   04/23/13 1126  BP: 176/80  Pulse: 72  Temp: 97.9 F (36.6 C)  Resp: 16     General appearance: alert, cooperative and appears stated age Ears: normal TM's and external ear canals both ears Throat: lips, mucosa, and tongue normal; teeth and gums normal Neck: no adenopathy, no carotid bruit, supple, symmetrical, trachea midline and thyroid not enlarged, symmetric, no tenderness/mass/nodules Back: symmetric, no curvature. ROM normal. No CVA tenderness. Lungs: clear to auscultation bilaterally Heart: regular rate and rhythm, S1, S2 normal, no murmur, click, rub or gallop Abdomen: soft, non-tender; bowel sounds normal; no masses,  no organomegaly Pulses: 2+ and symmetric Skin: Skin color, texture, turgor normal. No rashes or lesions Lymph nodes: Cervical, supraclavicular, and axillary nodes normal. Foot exam:  Nails are well trimmed,  No callouses,  Sensation intact to microfilament  Assessment and Plan:  Glaucoma Unsure what Dr Mickey Farber plans are since I have not received his records..  She may need a new referral  Type 2 diabetes mellitus, controlled Well-controlled on current medications.  hemoglobin A1c has been consistently less than 7.0  She is up-to-date on eye exams and his foot exam is normal.  we'll repeat her urine microalbumin to creatinine ratio at next visit.s He is on the  appropriate medications.  Lab Results  Component Value Date   HGBA1C 6.6* 12/17/2012   Lab Results  Component Value Date   MICROALBUR 1.3 12/17/2012      Hypertension Elevated today.  Reviewed list of meds, patient is not taking OTC meds that could be causing,. It.  Have asked patient to recheck bp at home a minimum of 5 times over the next 4 weeks and call readings to office for adjustment of medications.   Her insurance is requiring  her to switch from Bystolic to  Atenolol  . She did not tolerate metoprolol in the past due to fatigue.  PA uderway  Vascular dementia, uncomplicated She is managing ok thus far.  No medication mishaps  Other and unspecified hyperlipidemia Fasting lipids were done in February and LDL was 135 currently.  Given her extreme age, statins have not been prescribed      Updated Medication List Outpatient Encounter Prescriptions as of 04/23/2013  Medication Sig  . ACCU-CHEK SOFTCLIX LANCETS lancets 1 each by Other route daily. Use as instructed  . brinzolamide (AZOPT) 1 % ophthalmic suspension 1 drop 2 (two) times daily.    . cyanocobalamin (,VITAMIN B-12,) 1000 MCG/ML injection Inject 1 mL (1,000 mcg total) into the muscle every 30 (thirty) days.  . diphenhydramine-acetaminophen (TYLENOL PM) 25-500 MG TABS Take 1 tablet by mouth at bedtime as needed.  . fish oil-omega-3 fatty acids 1000 MG capsule Take 1,000 mg by mouth daily.   Marland Kitchen glipiZIDE (GLUCOTROL) 5 MG tablet Take 1 tablet in the am and 1/2 tablet in the pm.  . glucose blood (ACCU-CHEK AVIVA) test strip 1 each by other route 2 times daily. Use as instructed.  Marland Kitchen losartan (COZAAR) 100 MG tablet Take 1 tablet (100 mg total) by mouth daily.  . Multiple Vitamins-Minerals (ICAPS PO) Take by mouth.    . nebivolol (BYSTOLIC) 5 MG tablet Take 1 tablet (5 mg total) by mouth daily.  . Psyllium (METAMUCIL PO) Take by mouth as needed.  . Simethicone (GAS RELIEF PO) Take by mouth. Takes 1-2 tablets prn  . traMADol (ULTRAM) 50 MG tablet Take 1 tablet (50 mg total) by mouth every 6 (six) hours as needed.  . [DISCONTINUED] nebivolol (BYSTOLIC) 5 MG tablet Take 1 tablet (5 mg total) by mouth daily.  Marland Kitchen aspirin 81 MG tablet Take 81 mg by mouth daily.  . Calcium Carbonate-Vitamin D (CALCIUM + D) 600-200 MG-UNIT TABS Take 0.5 tablets by mouth daily.      Orders Placed This Encounter  Procedures  . Pneumococcal conjugate vaccine 13-valent  . Hemoglobin A1c   . LDL cholesterol, direct  . Microalbumin / creatinine urine ratio  . Comprehensive metabolic panel  . Ambulatory referral to Ophthalmology    No Follow-up on file.

## 2013-04-26 ENCOUNTER — Encounter: Payer: Self-pay | Admitting: Internal Medicine

## 2013-04-26 NOTE — Assessment & Plan Note (Signed)
Elevated today.  Reviewed list of meds, patient is not taking OTC meds that could be causing,. It.  Have asked patient to recheck bp at home a minimum of 5 times over the next 4 weeks and call readings to office for adjustment of medications.   Her insurance is requiring her to switch from Bystolic to  Atenolol  . She did not tolerate metoprolol in the past due to fatigue.  PA uderway   

## 2013-04-26 NOTE — Assessment & Plan Note (Signed)
Well-controlled on current medications.  hemoglobin A1c has been consistently less than 7.0  She is up-to-date on eye exams and his foot exam is normal.  we'll repeat her urine microalbumin to creatinine ratio at next visit.s He is on the appropriate medications.  Lab Results  Component Value Date   HGBA1C 6.6* 12/17/2012   Lab Results  Component Value Date   MICROALBUR 1.3 12/17/2012

## 2013-04-26 NOTE — Assessment & Plan Note (Signed)
She is managing ok thus far.  No medication mishaps

## 2013-04-26 NOTE — Assessment & Plan Note (Signed)
Unsure what Dr Mickey Farber plans are since I have not received his records..  She may need a new referral

## 2013-04-26 NOTE — Assessment & Plan Note (Signed)
Fasting lipids were done in February and LDL was 135 currently.  Given her extreme age, statins have not been prescribed

## 2013-04-30 ENCOUNTER — Telehealth: Payer: Self-pay

## 2013-04-30 NOTE — Telephone Encounter (Signed)
Relevant patient education assigned to patient using Emmi. ° °

## 2013-05-05 ENCOUNTER — Telehealth: Payer: Self-pay | Admitting: Internal Medicine

## 2013-05-05 NOTE — Telephone Encounter (Signed)
Patient notified as requested. 

## 2013-05-05 NOTE — Telephone Encounter (Signed)
BPSs are normal  No changes to meds needed

## 2013-06-05 ENCOUNTER — Encounter: Payer: Self-pay | Admitting: Internal Medicine

## 2013-06-30 ENCOUNTER — Other Ambulatory Visit: Payer: Self-pay | Admitting: Internal Medicine

## 2013-07-29 ENCOUNTER — Encounter: Payer: Self-pay | Admitting: Cardiovascular Disease

## 2013-07-29 ENCOUNTER — Ambulatory Visit (INDEPENDENT_AMBULATORY_CARE_PROVIDER_SITE_OTHER): Payer: Commercial Managed Care - HMO | Admitting: Cardiovascular Disease

## 2013-07-29 VITALS — BP 160/90 | HR 79 | Ht 64.0 in | Wt 122.8 lb

## 2013-07-29 DIAGNOSIS — I10 Essential (primary) hypertension: Secondary | ICD-10-CM

## 2013-07-29 DIAGNOSIS — I447 Left bundle-branch block, unspecified: Secondary | ICD-10-CM

## 2013-07-29 DIAGNOSIS — I509 Heart failure, unspecified: Secondary | ICD-10-CM

## 2013-07-29 DIAGNOSIS — I5022 Chronic systolic (congestive) heart failure: Secondary | ICD-10-CM

## 2013-07-29 NOTE — Assessment & Plan Note (Signed)
Blood pressure is mildly elevated. She reports that her readings at home. Continue to monitor and consider adding amlodipine if blood pressure remains elevated.

## 2013-07-29 NOTE — Progress Notes (Signed)
HPI  This is a 80 y.o. pleasant female who is here today for a followup visit regarding cardiomyopathy and LBBB . She was admitted to [facility] in May of 2013 for accelerated hypertension with epistaxis. She had an echocardiogram done which showed an ejection fraction of 40% with septal motion abnormalities suggestive of conduction disease as well as septal and distal anterior hypokinesis with evidence of moderate pulmonary hypertension.  She did not tolerate metoprolol or carvedilol due to fatigue . She has been tolerating Bystolic. Overall, she has been doing well with no chest pain or dyspnea.  She continues to exercise regularly without significant limitations. Blood pressure tends to fluctuate. She is suffering from macular degeneration and stopped driving for that reason.  No Known Allergies   Current Outpatient Prescriptions on File Prior to Visit  Medication Sig Dispense Refill  . ACCU-CHEK SOFTCLIX LANCETS lancets 1 each by Other route daily. Use as instructed  100 each  3  . aspirin 81 MG tablet Take 81 mg by mouth daily.      . brinzolamide (AZOPT) 1 % ophthalmic suspension 1 drop 2 (two) times daily.        . Calcium Carbonate-Vitamin D (CALCIUM + D) 600-200 MG-UNIT TABS Take 0.5 tablets by mouth daily.       . cyanocobalamin (,VITAMIN B-12,) 1000 MCG/ML injection Inject 1 mL (1,000 mcg total) into the muscle every 30 (thirty) days.  10 mL  3  . diphenhydramine-acetaminophen (TYLENOL PM) 25-500 MG TABS Take 1 tablet by mouth at bedtime as needed.      . fish oil-omega-3 fatty acids 1000 MG capsule Take 1,000 mg by mouth daily.       Marland Kitchen glipiZIDE (GLUCOTROL) 5 MG tablet Take 1 tablet in the am and 1/2 tablet in the pm.  135 tablet  1  . glucose blood (ACCU-CHEK AVIVA) test strip Check blood sugar twice daily Dx 250.00  50 each  5  . losartan (COZAAR) 100 MG tablet Take 1 tablet (100 mg total) by mouth daily.  90 tablet  1  . Multiple Vitamins-Minerals (ICAPS PO) Take by mouth 2 (two)  times daily.       . nebivolol (BYSTOLIC) 5 MG tablet Take 1 tablet (5 mg total) by mouth daily.  30 tablet  0  . Psyllium (METAMUCIL PO) Take by mouth as needed.      . Simethicone (GAS RELIEF PO) Take by mouth. Takes 1-2 tablets prn      . traMADol (ULTRAM) 50 MG tablet Take 1 tablet (50 mg total) by mouth every 6 (six) hours as needed.  90 tablet  3   No current facility-administered medications on file prior to visit.     Past Medical History  Diagnosis Date  . Diabetes mellitus     diet controlled  . Glaucoma   . Hypertension      Past Surgical History  Procedure Laterality Date  . Cataract extraction       Family History  Problem Relation Age of Onset  . Parkinsonism Mother   . Hypertension Brother      History   Social History  . Marital Status: Married    Spouse Name: N/A    Number of Children: N/A  . Years of Education: N/A   Occupational History  . Not on file.   Social History Main Topics  . Smoking status: Never Smoker   . Smokeless tobacco: Never Used  . Alcohol Use: Yes  Comment: occasional-wine  . Drug Use: No  . Sexual Activity: Not on file   Other Topics Concern  . Not on file   Social History Narrative  . No narrative on file      PHYSICAL EXAM   BP 160/90  Pulse 79  Ht 5\' 4"  (1.626 m)  Wt 122 lb 12 oz (55.679 kg)  BMI 21.06 kg/m2 Constitutional: She is oriented to person, place, and time. She appears well-developed and well-nourished. No distress.  HENT: No nasal discharge.  Head: Normocephalic and atraumatic.  Eyes: Pupils are equal and round. Right eye exhibits no discharge. Left eye exhibits no discharge.  Neck: Normal range of motion. Neck supple. No JVD present. No thyromegaly present.  Cardiovascular: Normal rate, regular rhythm, normal heart sounds. Exam reveals no gallop and no friction rub. No murmur heard.  Pulmonary/Chest: Effort normal and breath sounds normal. No stridor. No respiratory distress. She has no  wheezes. She has no rales. She exhibits no tenderness.  Abdominal: Soft. Bowel sounds are normal. She exhibits no distension. There is no tenderness. There is no rebound and no guarding.  Musculoskeletal: Normal range of motion. She exhibits no edema and no tenderness.  Neurological: She is alert and oriented to person, place, and time. Coordination normal.  Skin: Skin is warm and dry. No rash noted. She is not diaphoretic. No erythema. No pallor.  Psychiatric: She has a normal mood and affect. Her behavior is normal. Judgment and thought content normal.     EKG: Sinus  Rhythm  -First degree A-V block  PRi = 266 -Left bundle branch block and left axis.   ABNORMAL      ASSESSMENT AND PLAN

## 2013-07-29 NOTE — Assessment & Plan Note (Signed)
She has underlying first degree AV block. No symptoms of dizziness, syncope or presyncope.   

## 2013-07-29 NOTE — Assessment & Plan Note (Signed)
The patient has cardiomyopathy with an ejection fraction of 40% in the setting of uncontrolled hypertension likely due to hypertensive heart disease. She is currently New York Heart Association class II and appears to be euvolemic. Due to her age, lack of significant symptoms related to this, no ischemic cardiac evaluation was pursued. There is no evidence of fluid overload. Continue treatment with losartan and Bystolic.

## 2013-07-29 NOTE — Patient Instructions (Signed)
Continue same medications.   Your physician wants you to follow-up in: 1 year.  You will receive a reminder letter in the mail two months in advance. If you don't receive a letter, please call our office to schedule the follow-up appointment.  

## 2013-08-04 ENCOUNTER — Encounter: Payer: Self-pay | Admitting: Internal Medicine

## 2013-08-04 ENCOUNTER — Ambulatory Visit (INDEPENDENT_AMBULATORY_CARE_PROVIDER_SITE_OTHER): Payer: Commercial Managed Care - HMO | Admitting: Internal Medicine

## 2013-08-04 ENCOUNTER — Ambulatory Visit: Payer: Commercial Managed Care - HMO | Admitting: Internal Medicine

## 2013-08-04 VITALS — BP 158/82 | HR 75 | Temp 97.8°F | Resp 16 | Ht 64.0 in | Wt 123.5 lb

## 2013-08-04 DIAGNOSIS — R351 Nocturia: Secondary | ICD-10-CM

## 2013-08-04 DIAGNOSIS — E119 Type 2 diabetes mellitus without complications: Secondary | ICD-10-CM

## 2013-08-04 DIAGNOSIS — I10 Essential (primary) hypertension: Secondary | ICD-10-CM

## 2013-08-04 LAB — POCT URINALYSIS DIPSTICK
BILIRUBIN UA: NEGATIVE
GLUCOSE UA: NEGATIVE
KETONES UA: NEGATIVE
Leukocytes, UA: NEGATIVE
Nitrite, UA: NEGATIVE
Protein, UA: NEGATIVE
RBC UA: NEGATIVE
SPEC GRAV UA: 1.025
Urobilinogen, UA: 1
pH, UA: 6.5

## 2013-08-04 MED ORDER — CYANOCOBALAMIN 1000 MCG/ML IJ SOLN: 1000.0000 ug | INTRAMUSCULAR | Status: DC

## 2013-08-04 MED ORDER — CYANOCOBALAMIN 1000 MCG/ML IJ SOLN: 1000.0000 ug | Freq: Once | INTRAMUSCULAR | Status: DC

## 2013-08-04 NOTE — Progress Notes (Signed)
Pre-visit discussion using our clinic review tool. No additional management support is needed unless otherwise documented below in the visit note.  

## 2013-08-04 NOTE — Patient Instructions (Addendum)
You have lost 9 lbs this year.  That is not good.  You don't need to lose any more weight  Please consider keeping Glucerna in your refrigerator  For those days when you are too busy to prepare  Food  You can increase your melatonin to 2 tablets daily  ,  You can combine with tylenol PM if needed   Your morning blood sugars  Are too low.  Stop the evening dose of glipizide.  Continue the morning dose at or after breakfast  Please get fasting labs drawn by the RN ASAP   Avoid using the pick laxative more than once a week,.  You can use Metamucil, Benefiber, or Citrucel every day to maintain regularity

## 2013-08-04 NOTE — Progress Notes (Addendum)
Patient ID: Kaitlin Edwards, female   DOB: Jan 10, 1921, 80 y.o.   MRN: [id number]   Overactive bladder.  Nocturia  x 5   Patient Active Problem List   Diagnosis Date Noted  . Other and unspecified hyperlipidemia 12/18/2012  . Other malaise and fatigue 08/22/2012  . Nocturia 03/27/2012  . Vascular dementia, uncomplicated 59/45/8592  . Chronic systolic congestive heart failure 07/24/2011  . Left bundle branch block 07/23/2011  . Anosmia 02/05/2011  . Screening for breast cancer 11/05/2010  . Osteoporosis, post-menopausal 11/05/2010  . Type 2 diabetes mellitus, controlled   . Glaucoma   . Hypertension     Subjective:  CC:   Chief Complaint  Patient presents with  . Follow-up  . Diabetes    HPI:   Kaitlin Edwards is a 80 y.o. female who presents for follow up on DM Type 2, dementia and hypertension,  Her bladder has  Been overactive and she is having nocturia x5.    Blood sugar log reviewed,  Sugars are undr good control  without lows.    Past Medical History  Diagnosis Date  . Diabetes mellitus     diet controlled  . Glaucoma   . Hypertension     Past Surgical History  Procedure Laterality Date  . Cataract extraction         The following portions of the patient's history were reviewed and updated as appropriate: Allergies, current medications, and problem list.    Review of Systems:   Patient denies headache, fevers, malaise, unintentional weight loss, skin rash, eye pain, sinus congestion and sinus pain, sore throat, dysphagia,  hemoptysis , cough, dyspnea, wheezing, chest pain, palpitations, orthopnea, edema, abdominal pain, nausea, melena, diarrhea, constipation, flank pain, dysuria, hematuria, urinary  Frequency, nocturia, numbness, tingling, seizures,  Focal weakness, Loss of consciousness,  Tremor, insomnia, depression, anxiety, and suicidal ideation.     History   Social History  . Marital Status: Married    Spouse Name: N/A    Number of Children: N/A   . Years of Education: N/A   Occupational History  . Not on file.   Social History Main Topics  . Smoking status: Never Smoker   . Smokeless tobacco: Never Used  . Alcohol Use: Yes     Comment: occasional-wine  . Drug Use: No  . Sexual Activity: Not on file   Other Topics Concern  . Not on file   Social History Narrative  . No narrative on file    Objective:  Filed Vitals:   08/04/13 1525  BP: 158/82  Pulse: 75  Temp: 97.8 F (36.6 C)  Resp: 16     General appearance: alert, cooperative and appears stated age Ears: normal TM's and external ear canals both ears Throat: lips, mucosa, and tongue normal; teeth and gums normal Neck: no adenopathy, no carotid bruit, supple, symmetrical, trachea midline and thyroid not enlarged, symmetric, no tenderness/mass/nodules Back: symmetric, no curvature. ROM normal. No CVA tenderness. Lungs: clear to auscultation bilaterally Heart: regular rate and rhythm, S1, S2 normal, no murmur, click, rub or gallop Abdomen: soft, non-tender; bowel sounds normal; no masses,  no organomegaly Pulses: 2+ and symmetric Skin: Skin color, texture, turgor normal. No rashes or lesions Lymph nodes: Cervical, supraclavicular, and axillary nodes normal.  Assessment and Plan:  Type 2 diabetes mellitus, controlled Well-controlled on current medications.  hemoglobin A1c has been consistently less than 7.0  She is up-to-date on eye exams and his foot exam is normal.  we'll repeat her urine  microalbumin to creatinine ratio at next visit.s He is on the appropriate medications.  Lab Results  Component Value Date   HGBA1C 6.6* 08/29/2013   Lab Results  Component Value Date   MICROALBUR 1.3 12/17/2012        Hypertension Elevated today.  Reviewed list of meds, patient is not taking OTC meds that could be causing,. It.  Have asked patient to recheck bp at home a minimum of 5 times over the next 4 weeks and call readings to office for adjustment of  medications.   Her insurance is requiring her to switch from Bystolic to  Atenolol  . She did not tolerate metoprolol in the past due to fatigue.  PA uderway    Nocturia Ua has bee repeatedly checked and normal   Updated Medication List Outpatient Encounter Prescriptions as of 08/04/2013  Medication Sig  . ACCU-CHEK SOFTCLIX LANCETS lancets 1 each by Other route daily. Use as instructed  . aspirin 81 MG tablet Take 81 mg by mouth daily.  . brinzolamide (AZOPT) 1 % ophthalmic suspension 1 drop 2 (two) times daily.    . cyanocobalamin (,VITAMIN B-12,) 1000 MCG/ML injection Inject 1 mL (1,000 mcg total) into the muscle once. I ml IM injection monthly  . cyanocobalamin (,VITAMIN B-12,) 1000 MCG/ML injection Inject 1 mL (1,000 mcg total) into the muscle every 30 (thirty) days.  . diphenhydramine-acetaminophen (TYLENOL PM) 25-500 MG TABS Take 1 tablet by mouth at bedtime as needed.  . fish oil-omega-3 fatty acids 1000 MG capsule Take 1,000 mg by mouth daily.   Marland Kitchen glucose blood (ACCU-CHEK AVIVA) test strip Check blood sugar twice daily Dx 250.00  . Multiple Vitamins-Minerals (ICAPS PO) Take by mouth 2 (two) times daily.   . Calcium Carbonate-Vitamin D (CALCIUM + D) 600-200 MG-UNIT TABS Take 0.5 tablets by mouth daily.   . Psyllium (METAMUCIL PO) Take by mouth as needed.  . Simethicone (GAS RELIEF PO) Take by mouth. Takes 1-2 tablets prn  . [DISCONTINUED] cyanocobalamin (,VITAMIN B-12,) 1000 MCG/ML injection Inject 1 mL (1,000 mcg total) into the muscle every 30 (thirty) days.  . [DISCONTINUED] glipiZIDE (GLUCOTROL) 5 MG tablet Take 1 tablet in the am and 1/2 tablet in the pm.  . [DISCONTINUED] losartan (COZAAR) 100 MG tablet Take 1 tablet (100 mg total) by mouth daily.  . [DISCONTINUED] nebivolol (BYSTOLIC) 5 MG tablet Take 1 tablet (5 mg total) by mouth daily.  . [DISCONTINUED] traMADol (ULTRAM) 50 MG tablet Take 1 tablet (50 mg total) by mouth every 6 (six) hours as needed.

## 2013-08-26 ENCOUNTER — Other Ambulatory Visit: Payer: Self-pay | Admitting: Internal Medicine

## 2013-08-29 ENCOUNTER — Telehealth: Payer: Self-pay | Admitting: Internal Medicine

## 2013-08-29 LAB — LIPID PANEL
Cholesterol: 252 mg/dL — AB (ref 0–200)
HDL: 94 mg/dL — AB (ref 35–70)
LDL Cholesterol: 142 mg/dL
TRIGLYCERIDES: 79 mg/dL (ref 40–160)

## 2013-08-29 LAB — CBC AND DIFFERENTIAL
Hemoglobin: 12.6 g/dL (ref 12.0–16.0)
Platelets: 301 10*3/uL (ref 150–399)
WBC: 9.1 10*3/mL

## 2013-08-29 LAB — HEMOGLOBIN A1C: HEMOGLOBIN A1C: 6.6 % — AB (ref 4.0–6.0)

## 2013-08-29 LAB — HEPATIC FUNCTION PANEL
ALT: 13 U/L (ref 7–35)
AST: 16 U/L (ref 13–35)
Alkaline Phosphatase: 81 U/L (ref 25–125)
BILIRUBIN, TOTAL: 0.6 mg/dL

## 2013-08-29 LAB — BASIC METABOLIC PANEL
BUN: 19 mg/dL (ref 4–21)
Creatinine: 0.7 mg/dL (ref 0.5–1.1)

## 2013-08-29 MED ORDER — ERGOCALCIFEROL 1.25 MG (50000 UT) PO CAPS
50000.0000 [IU] | ORAL_CAPSULE | ORAL | Status: DC
Start: 2013-08-29 — End: 2013-12-07

## 2013-08-29 NOTE — Telephone Encounter (Signed)
Labs are fine except that Your vitamin D is low, which can increase your risk of weak bones and fractures and interfere with your body's ability to absorb the calcium in your diet.   I am calling in a megadose of Vit D to take once weekly for a total of 3 months,  Then after you finish the weekly supplement, you should start taking an OTC  Vit D3 supplement 1000 units daily.   Regards,  Dr. Derrel Nip rx sentto [place]

## 2013-09-01 ENCOUNTER — Other Ambulatory Visit: Payer: Self-pay | Admitting: *Deleted

## 2013-09-01 MED ORDER — GLIPIZIDE 5 MG PO TABS: ORAL_TABLET | ORAL | Status: DC

## 2013-09-01 MED ORDER — NEBIVOLOL HCL 5 MG PO TABS: 5.0000 mg | ORAL_TABLET | Freq: Every day | ORAL | Status: DC

## 2013-09-01 NOTE — Telephone Encounter (Signed)
Patient called wanting lab results. 

## 2013-09-01 NOTE — Telephone Encounter (Signed)
Spoke with pt advised of MDs message 

## 2013-09-02 ENCOUNTER — Telehealth: Payer: Self-pay | Admitting: Internal Medicine

## 2013-09-02 MED ORDER — TRAMADOL HCL 50 MG PO TABS: 50.0000 mg | ORAL_TABLET | Freq: Four times a day (QID) | ORAL | Status: DC | PRN

## 2013-09-02 NOTE — Telephone Encounter (Signed)
Ok to refill,  printed rx  

## 2013-09-02 NOTE — Telephone Encounter (Signed)
Patient called requesting refill on tramadol  Last fill 02/18/13 ok to fill?

## 2013-09-03 NOTE — Telephone Encounter (Signed)
Script faxed patient notified. 

## 2013-11-05 ENCOUNTER — Encounter: Payer: Self-pay | Admitting: Internal Medicine

## 2013-11-05 ENCOUNTER — Ambulatory Visit (INDEPENDENT_AMBULATORY_CARE_PROVIDER_SITE_OTHER): Payer: Commercial Managed Care - HMO | Admitting: Internal Medicine

## 2013-11-05 VITALS — BP 128/78 | HR 72 | Temp 98.0°F | Resp 16 | Ht 64.0 in | Wt 124.0 lb

## 2013-11-05 DIAGNOSIS — I10 Essential (primary) hypertension: Secondary | ICD-10-CM

## 2013-11-05 DIAGNOSIS — E785 Hyperlipidemia, unspecified: Secondary | ICD-10-CM

## 2013-11-05 DIAGNOSIS — E119 Type 2 diabetes mellitus without complications: Secondary | ICD-10-CM

## 2013-11-05 MED ORDER — OXYBUTYNIN CHLORIDE 5 MG PO TABS: 5.0000 mg | ORAL_TABLET | Freq: Three times a day (TID) | ORAL | Status: DC

## 2013-11-05 NOTE — Assessment & Plan Note (Signed)
Well-controlled on current medications.  hemoglobin A1c has been consistently less than 7.0  She is up-to-date on eye exams and his foot exam is normal.  we'll repeat her urine microalbumin to creatinine ratio at next visit.s He is on the appropriate medications.  Lab Results  Component Value Date   HGBA1C 6.6* 08/29/2013   Lab Results  Component Value Date   MICROALBUR 1.3 12/17/2012

## 2013-11-05 NOTE — Assessment & Plan Note (Signed)
Ua has bee repeatedly checked and normal

## 2013-11-05 NOTE — Progress Notes (Signed)
Pre-visit discussion using our clinic review tool. No additional management support is needed unless otherwise documented below in the visit note.  

## 2013-11-05 NOTE — Assessment & Plan Note (Signed)
Elevated today.  Reviewed list of meds, patient is not taking OTC meds that could be causing,. It.  Have asked patient to recheck bp at home a minimum of 5 times over the next 4 weeks and call readings to office for adjustment of medications.   Her insurance is requiring her to switch from Bystolic to  Atenolol  . She did not tolerate metoprolol in the past due to fatigue.  PA Claudie Leach

## 2013-11-05 NOTE — Patient Instructions (Addendum)
Once you finish the green vitamin D capsule, I  want you to start taking Vit D3 1000 IUs (international units ) daily  You can get this over the counter in the vitamin section at your pharmacy  Your diabetes is under excellent control.    Do not change  A thing!  I am prescribing a medication for overactive bladder , Take it once daily in the evening  I sent the new medication to [facility]

## 2013-11-05 NOTE — Progress Notes (Signed)
Patient ID: Kaitlin Edwards, female   DOB: [birth year], 80 y.o.   MRN: [id number]  Patient Active Problem List   Diagnosis Date Noted  . Other and unspecified hyperlipidemia 12/18/2012  . Other malaise and fatigue 08/22/2012  . Nocturia 03/27/2012  . Vascular dementia, uncomplicated 58/85/0277  . Chronic systolic congestive heart failure 07/24/2011  . Left bundle branch block 07/23/2011  . Anosmia 02/05/2011  . Screening for breast cancer 11/05/2010  . Osteoporosis, post-menopausal 11/05/2010  . Type 2 diabetes mellitus, controlled   . Glaucoma   . Hypertension     Subjective:  CC:   Chief Complaint  Patient presents with  . Follow-up  . Diabetes    HPI:   Kaitlin Edwards is a 80 y.o. female who presents for  3 month follow up on DM Type 2, hypertension, and dementia.  She continues to function well with minimal supervision. She is checking her blood sugars daily at various times and her BCGS have been < 140.  She has had no hypoglycemic events.  She exercises regularly and her weight is stablr.  She has had no falls in the last year.     Past Medical History  Diagnosis Date  . Diabetes mellitus     diet controlled  . Glaucoma   . Hypertension     Past Surgical History  Procedure Laterality Date  . Cataract extraction         The following portions of the patient's history were reviewed and updated as appropriate: Allergies, current medications, and problem list.    Review of Systems:   Patient denies headache, fevers, malaise, unintentional weight loss, skin rash, eye pain, sinus congestion and sinus pain, sore throat, dysphagia,  hemoptysis , cough, dyspnea, wheezing, chest pain, palpitations, orthopnea, edema, abdominal pain, nausea, melena, diarrhea, constipation, flank pain, dysuria, hematuria, urinary  Frequency, nocturia, numbness, tingling, seizures,  Focal weakness, Loss of consciousness,  Tremor, insomnia, depression, anxiety, and suicidal ideation.      History   Social History  . Marital Status: Married    Spouse Name: N/A    Number of Children: N/A  . Years of Education: N/A   Occupational History  . Not on file.   Social History Main Topics  . Smoking status: Never Smoker   . Smokeless tobacco: Never Used  . Alcohol Use: Yes     Comment: occasional-wine  . Drug Use: No  . Sexual Activity: Not on file   Other Topics Concern  . Not on file   Social History Narrative  . No narrative on file    Objective:  Filed Vitals:   11/05/13 1456  BP: 128/78  Pulse: 72  Temp: 98 F (36.7 C)  Resp: 16     General appearance: alert, cooperative and appears stated age Ears: normal TM's and external ear canals both ears Throat: lips, mucosa, and tongue normal; teeth and gums normal Neck: no adenopathy, no carotid bruit, supple, symmetrical, trachea midline and thyroid not enlarged, symmetric, no tenderness/mass/nodules Back: symmetric, no curvature. ROM normal. No CVA tenderness. Lungs: clear to auscultation bilaterally Heart: regular rate and rhythm, S1, S2 normal, no murmur, click, rub or gallop Abdomen: soft, non-tender; bowel sounds normal; no masses,  no organomegaly Pulses: 2+ and symmetric Skin: Skin color, texture, turgor normal. No rashes or lesions Lymph nodes: Cervical, supraclavicular, and axillary nodes normal.  Assessment and Plan:  Type 2 diabetes mellitus, controlled Well-controlled on current medications.  hemoglobin A1c has been consistently less than 7.0  She is up-to-date on eye exams and his foot exam is normal.  we'll repeat her urine microalbumin to creatinine ratio at next visit.s He is on the appropriate medications.  Lab Results  Component Value Date   HGBA1C 6.6* 08/29/2013   Lab Results  Component Value Date   MICROALBUR 1.3 12/17/2012          Other and unspecified hyperlipidemia Not treated due to extreme age  Lab Results  Component Value Date   CHOL 252* 08/29/2013   HDL  94* 08/29/2013   LDLCALC 142 08/29/2013   LDLDIRECT 134.4 12/17/2012   TRIG 79 08/29/2013     Hypertension Well controlled on current regimen. Renal function stable, no changes today.  Lab Results  Component Value Date   CREATININE 0.7 08/29/2013   Lab Results  Component Value Date   NA 134* 12/17/2012   K 3.9 12/17/2012   CL 101 12/17/2012   CO2 24 12/17/2012      Updated Medication List Outpatient Encounter Prescriptions as of 11/05/2013  Medication Sig  . ACCU-CHEK SOFTCLIX LANCETS lancets 1 each by Other route daily. Use as instructed  . aspirin 81 MG tablet Take 81 mg by mouth daily.  . brinzolamide (AZOPT) 1 % ophthalmic suspension 1 drop 2 (two) times daily.    . cyanocobalamin (,VITAMIN B-12,) 1000 MCG/ML injection Inject 1 mL (1,000 mcg total) into the muscle once. I ml IM injection monthly  . cyanocobalamin (,VITAMIN B-12,) 1000 MCG/ML injection Inject 1 mL (1,000 mcg total) into the muscle every 30 (thirty) days.  . diphenhydramine-acetaminophen (TYLENOL PM) 25-500 MG TABS Take 1 tablet by mouth at bedtime as needed.  . docusate sodium (COLACE) 100 MG capsule Take 100 mg by mouth 2 (two) times daily.  . ergocalciferol (DRISDOL) 50000 UNITS capsule Take 1 capsule (50,000 Units total) by mouth once a week.  . fish oil-omega-3 fatty acids 1000 MG capsule Take 1,000 mg by mouth daily.   Marland Kitchen glipiZIDE (GLUCOTROL) 5 MG tablet Take 1 tablet in the am and 1/2 tablet in the pm.  . glucose blood (ACCU-CHEK AVIVA) test strip Check blood sugar twice daily Dx 250.00  . losartan (COZAAR) 100 MG tablet TAKE 1 TABLET EVERY DAY  . Multiple Vitamins-Minerals (ICAPS PO) Take by mouth 2 (two) times daily.   . nebivolol (BYSTOLIC) 5 MG tablet Take 1 tablet (5 mg total) by mouth daily.  . traMADol (ULTRAM) 50 MG tablet Take 1 tablet (50 mg total) by mouth every 6 (six) hours as needed.  . [DISCONTINUED] Calcium Carbonate-Vitamin D (CALCIUM + D) 600-200 MG-UNIT TABS Take 0.5 tablets by mouth daily.    . [DISCONTINUED] oxybutynin (DITROPAN) 5 MG tablet Take 1 tablet (5 mg total) by mouth 3 (three) times daily.  . [DISCONTINUED] Psyllium (METAMUCIL PO) Take by mouth as needed.  . [DISCONTINUED] Simethicone (GAS RELIEF PO) Take by mouth. Takes 1-2 tablets prn     No orders of the defined types were placed in this encounter.    No Follow-up on file.

## 2013-11-08 ENCOUNTER — Encounter: Payer: Self-pay | Admitting: Internal Medicine

## 2013-11-08 NOTE — Assessment & Plan Note (Signed)
Well controlled on current regimen. Renal function stable, no changes today.  Lab Results  Component Value Date   CREATININE 0.7 08/29/2013   Lab Results  Component Value Date   NA 134* 12/17/2012   K 3.9 12/17/2012   CL 101 12/17/2012   CO2 24 12/17/2012

## 2013-11-08 NOTE — Assessment & Plan Note (Signed)
Not treated due to extreme age  Lab Results  Component Value Date   CHOL 252* 08/29/2013   HDL 94* 08/29/2013   LDLCALC 142 08/29/2013   LDLDIRECT 134.4 12/17/2012   TRIG 79 08/29/2013

## 2013-11-08 NOTE — Assessment & Plan Note (Signed)
Well-controlled on current medications.  hemoglobin A1c has been consistently less than 7.0  She is up-to-date on eye exams and his foot exam is normal.  we'll repeat her urine microalbumin to creatinine ratio at next visit.s He is on the appropriate medications.  Lab Results  Component Value Date   HGBA1C 6.6* 08/29/2013   Lab Results  Component Value Date   MICROALBUR 1.3 12/17/2012

## 2013-11-17 ENCOUNTER — Telehealth: Payer: Self-pay | Admitting: Internal Medicine

## 2013-11-17 MED ORDER — TRAMADOL HCL 50 MG PO TABS: 50.0000 mg | ORAL_TABLET | Freq: Four times a day (QID) | ORAL | Status: DC | PRN

## 2013-11-17 NOTE — Telephone Encounter (Signed)
Ok to refill,  printed rx  

## 2013-11-17 NOTE — Telephone Encounter (Signed)
Patient requesting refill on tramadol ok to fill last fill 7/15

## 2013-11-18 NOTE — Telephone Encounter (Signed)
Script faxed as requested.

## 2013-11-21 ENCOUNTER — Telehealth: Payer: Self-pay

## 2013-11-21 MED ORDER — NEBIVOLOL HCL 5 MG PO TABS: 5.0000 mg | ORAL_TABLET | Freq: Every day | ORAL | Status: DC

## 2013-11-21 NOTE — Telephone Encounter (Signed)
The patient called and is hoping to get a refill of her bystolic medication. Callback - [phone]

## 2013-11-21 NOTE — Telephone Encounter (Signed)
Rx sent to pharmacy by escript  

## 2013-12-04 LAB — TSH: TSH: 1.56 u[IU]/mL (ref 0.41–5.90)

## 2013-12-04 LAB — BASIC METABOLIC PANEL
BUN: 18 mg/dL (ref 4–21)
Creatinine: 0.9 mg/dL (ref 0.5–1.1)
GLUCOSE: 100 mg/dL
POTASSIUM: 4.3 mmol/L (ref 3.4–5.3)
SODIUM: 138 mmol/L (ref 137–147)

## 2013-12-04 LAB — HEPATIC FUNCTION PANEL
ALT: 16 U/L (ref 7–35)
AST: 15 U/L (ref 13–35)
Alkaline Phosphatase: 58 U/L (ref 25–125)
BILIRUBIN, TOTAL: 0.8 mg/dL

## 2013-12-04 LAB — LIPID PANEL
Cholesterol: 246 mg/dL — AB (ref 0–200)
HDL: 96 mg/dL — AB (ref 35–70)
LDL CALC: 138 mg/dL
Triglycerides: 62 mg/dL (ref 40–160)

## 2013-12-04 LAB — HEMOGLOBIN A1C: Hgb A1c MFr Bld: 6.5 % — AB (ref 4.0–6.0)

## 2013-12-07 ENCOUNTER — Telehealth: Payer: Self-pay | Admitting: Internal Medicine

## 2013-12-07 MED ORDER — ERGOCALCIFEROL 1.25 MG (50000 UT) PO CAPS: 50000.0000 [IU] | ORAL_CAPSULE | ORAL | Status: DC

## 2013-12-07 NOTE — Telephone Encounter (Signed)
Your cholesterol, a1c ,  liver and kidney function are normal.  You do not need any medication changes. Please plan to repeat the labs in 6 months.  Continue the weekly Vitami d supplement ,   refill sent to pharmacy   Labs need abstracting,  In red folder.  thanks  [place],   Dr. Derrel Nip

## 2013-12-08 ENCOUNTER — Encounter: Payer: Self-pay | Admitting: *Deleted

## 2013-12-08 NOTE — Telephone Encounter (Signed)
Pt notified and verbalized understanding.  Labs abstracted

## 2014-01-19 ENCOUNTER — Encounter: Payer: Self-pay | Admitting: Internal Medicine

## 2014-01-22 ENCOUNTER — Telehealth: Payer: Self-pay | Admitting: Internal Medicine

## 2014-01-22 MED ORDER — NEBIVOLOL HCL 10 MG PO TABS: 10.0000 mg | ORAL_TABLET | Freq: Every day | ORAL | Status: DC

## 2014-01-22 NOTE — Telephone Encounter (Signed)
Received a message from [place] from [facility] re patient having very elevated BPs recently.  Patient has dementia so need to respond to [place] .    Her phone number is [phone] please tell her that I am increasing patient's BP medication, Bystolic (nebivolol)   From 5 mg once daily to  10 mg once daily and sent new rx to [facility]

## 2014-01-22 NOTE — Telephone Encounter (Signed)
[name] notified, states pt has [facility], Rx sent there too.  verbalized understanding

## 2014-03-04 ENCOUNTER — Encounter: Payer: Self-pay | Admitting: Internal Medicine

## 2014-03-04 ENCOUNTER — Ambulatory Visit (INDEPENDENT_AMBULATORY_CARE_PROVIDER_SITE_OTHER): Payer: Commercial Managed Care - HMO | Admitting: Internal Medicine

## 2014-03-04 VITALS — BP 158/82 | HR 76 | Temp 97.7°F | Resp 14 | Ht 64.0 in | Wt 116.5 lb

## 2014-03-04 DIAGNOSIS — E119 Type 2 diabetes mellitus without complications: Secondary | ICD-10-CM

## 2014-03-04 DIAGNOSIS — F0150 Vascular dementia without behavioral disturbance: Secondary | ICD-10-CM

## 2014-03-04 DIAGNOSIS — E785 Hyperlipidemia, unspecified: Secondary | ICD-10-CM

## 2014-03-04 DIAGNOSIS — E1169 Type 2 diabetes mellitus with other specified complication: Secondary | ICD-10-CM

## 2014-03-04 DIAGNOSIS — I10 Essential (primary) hypertension: Secondary | ICD-10-CM

## 2014-03-04 MED ORDER — TETANUS-DIPHTH-ACELL PERTUSSIS 5-2.5-18.5 LF-MCG/0.5 IM SUSP: 0.5000 mL | Freq: Once | INTRAMUSCULAR | Status: DC

## 2014-03-04 NOTE — Patient Instructions (Addendum)
You are doing well  Your still need your tetanus-diptheria-pertussis vaccine (TDaP) but you can get it for less $$$ at a local pharmacy with the script I have provided you.   Please have the RN check your blood pressure next week and call me with the results   tramadol is a pain medication for severe pain ,  If you do not need it don't take it,  You can try tylenol and motrin every 8 hours for pain   You can use metamucil capsules daily for management of constipation  You have lost 8 lbs  .  Make sure you are supplementing your diet with boost,  Ensure, glucerna, or carnation instant breakfast drink   You can't get you labs done until after January 22nd,  So take the letter to the RN at [place]

## 2014-03-04 NOTE — Progress Notes (Signed)
Pre-visit discussion using our clinic review tool. No additional management support is needed unless otherwise documented below in the visit note.  

## 2014-03-04 NOTE — Progress Notes (Signed)
Patient ID: Kaitlin Edwards, female   DOB: 1920/06/21, 81 y.o.   MRN: [id number]  Patient Active Problem List   Diagnosis Date Noted  . Hyperlipidemia associated with type 2 diabetes mellitus 12/18/2012  . Other malaise and fatigue 08/22/2012  . Nocturia 03/27/2012  . Vascular dementia, uncomplicated 15/40/0867  . Chronic systolic congestive heart failure 07/24/2011  . Left bundle branch block 07/23/2011  . Anosmia 02/05/2011  . Screening for breast cancer 11/05/2010  . Osteoporosis, post-menopausal 11/05/2010  . Type 2 diabetes mellitus, controlled   . Glaucoma   . Hypertension     Subjective:  CC:   Chief Complaint  Patient presents with  . Follow-up  . Diabetes    HPI:   Kaitlin Edwards is a 81 y.o. female who presents for 3 month follow up on DM type well controlled, hypertension and dementia. .  She has been taking her medications as directed and has brought a log of her blood sugars for review.  She feels generally well,  Is recovering from a viral  URI last Monday. But she has lost her appetite,  No nausea,  Has lost 8 lbs , doesn't  feel bad .  Has constipation using laxative 4 times weekly T  She took her BP medications just an hour again,  But states that since bystolic dose was incresaed, home measurements by the RN  at [facility] are 140/80 or less    Past Medical History  Diagnosis Date  . Diabetes mellitus     diet controlled  . Glaucoma   . Hypertension     Past Surgical History  Procedure Laterality Date  . Cataract extraction         The following portions of the patient's history were reviewed and updated as appropriate: Allergies, current medications, and problem list.    Review of Systems:   Patient denies headache, fevers, malaise, unintentional weight loss, skin rash, eye pain, sinus congestion and sinus pain, sore throat, dysphagia,  hemoptysis , cough, dyspnea, wheezing, chest pain, palpitations, orthopnea, edema, abdominal pain, nausea,  melena, diarrhea, constipation, flank pain, dysuria, hematuria, urinary  Frequency, nocturia, numbness, tingling, seizures,  Focal weakness, Loss of consciousness,  Tremor, insomnia, depression, anxiety, and suicidal ideation.     History   Social History  . Marital Status: Married    Spouse Name: N/A    Number of Children: N/A  . Years of Education: N/A   Occupational History  . Not on file.   Social History Main Topics  . Smoking status: Never Smoker   . Smokeless tobacco: Never Used  . Alcohol Use: Yes     Comment: occasional-wine  . Drug Use: No  . Sexual Activity: Not on file   Other Topics Concern  . Not on file   Social History Narrative    Objective:  Filed Vitals:   03/04/14 1354  BP: 158/82  Pulse: 76  Temp: 97.7 F (36.5 C)  Resp: 14     General appearance: alert, cooperative and appears stated age Ears: normal TM's and external ear canals both ears Throat: lips, mucosa, and tongue normal; teeth and gums normal Neck: no adenopathy, no carotid bruit, supple, symmetrical, trachea midline and thyroid not enlarged, symmetric, no tenderness/mass/nodules Back: symmetric, no curvature. ROM normal. No CVA tenderness. Lungs: clear to auscultation bilaterally Heart: regular rate and rhythm, S1, S2 normal, no murmur, click, rub or gallop Abdomen: soft, non-tender; bowel sounds normal; no masses,  no organomegaly Pulses: 2+ and symmetric Skin: Skin  color, texture, turgor normal. No rashes or lesions Lymph nodes: Cervical, supraclavicular, and axillary nodes normal.  Assessment and Plan:  Vascular dementia, uncomplicated There has been no progression of her cognitive decline.  She is well groomed, articulate and appears to be managing her medications  Well.    Hypertension Managed with Bystolic and losartan.  No changes today, labs are up to date . Lab Results  Component Value Date   CREATININE 0.9 12/04/2013   Lab Results  Component Value Date   NA 138  12/04/2013   K 4.3 12/04/2013   CL 101 12/17/2012   CO2 24 12/17/2012                   Type 2 diabetes mellitus, controlled Well-controlled on current medications.  hemoglobin A1c has been consistently less than 7.0  She is up-to-date on eye exams and his foot exam is normal.  we'll repeat her urine micro albumin to creatinine ratio at next visit.  She is on the appropriate medications.  Lab Results  Component Value Date   HGBA1C 6.5* 12/04/2013   Lab Results  Component Value Date   MICROALBUR 1.3 12/17/2012     Hyperlipidemia associated with type 2 diabetes mellitus Not treated due to extreme age and high HDL.   Lab Results  Component Value Date   CHOL 246* 12/04/2013   HDL 96* 12/04/2013   LDLCALC 138 12/04/2013   LDLDIRECT 134.4 12/17/2012   TRIG 62 12/04/2013         Updated Medication List Outpatient Encounter Prescriptions as of 03/04/2014  Medication Sig  . ACCU-CHEK SOFTCLIX LANCETS lancets 1 each by Other route daily. Use as instructed  . aspirin 81 MG tablet Take 81 mg by mouth daily.  . brinzolamide (AZOPT) 1 % ophthalmic suspension 1 drop 2 (two) times daily.    . cyanocobalamin (,VITAMIN B-12,) 1000 MCG/ML injection Inject 1 mL (1,000 mcg total) into the muscle once. I ml IM injection monthly  . cyanocobalamin (,VITAMIN B-12,) 1000 MCG/ML injection Inject 1 mL (1,000 mcg total) into the muscle every 30 (thirty) days.  . diphenhydramine-acetaminophen (TYLENOL PM) 25-500 MG TABS Take 1 tablet by mouth at bedtime as needed.  . docusate sodium (COLACE) 100 MG capsule Take 100 mg by mouth 2 (two) times daily.  . ergocalciferol (DRISDOL) 50000 UNITS capsule Take 1 capsule (50,000 Units total) by mouth once a week.  . fish oil-omega-3 fatty acids 1000 MG capsule Take 1,000 mg by mouth daily.   Marland Kitchen glipiZIDE (GLUCOTROL) 5 MG tablet Take 1 tablet in the am and 1/2 tablet in the pm.  . glucose blood (ACCU-CHEK AVIVA) test strip Check blood sugar twice daily Dx  250.00  . losartan (COZAAR) 100 MG tablet TAKE 1 TABLET EVERY DAY  . Multiple Vitamins-Minerals (ICAPS PO) Take by mouth 2 (two) times daily.   . nebivolol (BYSTOLIC) 10 MG tablet Take 1 tablet (10 mg total) by mouth daily.  . traMADol (ULTRAM) 50 MG tablet Take 1 tablet (50 mg total) by mouth every 6 (six) hours as needed.  . Tdap (BOOSTRIX) 5-2.5-18.5 LF-MCG/0.5 injection Inject 0.5 mLs into the muscle once.     No orders of the defined types were placed in this encounter.    No Follow-up on file.

## 2014-03-05 ENCOUNTER — Telehealth: Payer: Self-pay | Admitting: Internal Medicine

## 2014-03-05 NOTE — Telephone Encounter (Signed)
emmi mailed  °

## 2014-03-07 ENCOUNTER — Encounter: Payer: Self-pay | Admitting: Internal Medicine

## 2014-03-07 NOTE — Assessment & Plan Note (Signed)
Not treated due to extreme ag and high HDL.   Lab Results  Component Value Date   CHOL 246* 12/04/2013   HDL 96* 12/04/2013   LDLCALC 138 12/04/2013   LDLDIRECT 134.4 12/17/2012   TRIG 62 12/04/2013

## 2014-03-07 NOTE — Assessment & Plan Note (Signed)
Well-controlled on current medications.  hemoglobin A1c has been consistently less than 7.0  She is up-to-date on eye exams and his foot exam is normal.  we'll repeat her urine micro albumin to creatinine ratio at next visit.  She is on the appropriate medications.  Lab Results  Component Value Date   HGBA1C 6.5* 12/04/2013   Lab Results  Component Value Date   MICROALBUR 1.3 12/17/2012

## 2014-03-07 NOTE — Assessment & Plan Note (Signed)
Managed with Bystolic and losartan.  No changes today, labs are up to date . Lab Results  Component Value Date   CREATININE 0.9 12/04/2013   Lab Results  Component Value Date   NA 138 12/04/2013   K 4.3 12/04/2013   CL 101 12/17/2012   CO2 24 12/17/2012

## 2014-03-07 NOTE — Assessment & Plan Note (Signed)
There has been no progression of her cognitive decline.  She is well groomed, articulate and appears to be managing her medications  Well.

## 2014-03-10 ENCOUNTER — Telehealth: Payer: Self-pay

## 2014-03-10 NOTE — Telephone Encounter (Signed)
The patient called and needs to discuss her [facility] referrals

## 2014-03-12 LAB — BASIC METABOLIC PANEL
BUN: 16 mg/dL (ref 4–21)
Creatinine: 0.7 mg/dL (ref 0.5–1.1)
Glucose: 150 mg/dL
Potassium: 4.5 mmol/L (ref 3.4–5.3)
Sodium: 136 mmol/L — AB (ref 137–147)

## 2014-03-12 LAB — HEPATIC FUNCTION PANEL
ALK PHOS: 66 U/L (ref 25–125)
ALT: 14 U/L (ref 7–35)
AST: 15 U/L (ref 13–35)
BILIRUBIN, TOTAL: 1.1 mg/dL

## 2014-03-12 LAB — LIPID PANEL
Cholesterol: 243 mg/dL — AB (ref 0–200)
HDL: 89 mg/dL — AB (ref 35–70)
LDL CALC: 138 mg/dL
TRIGLYCERIDES: 90 mg/dL (ref 40–160)

## 2014-03-12 LAB — HEMOGLOBIN A1C: Hgb A1c MFr Bld: 7 % — AB (ref 4.0–6.0)

## 2014-03-20 ENCOUNTER — Encounter: Payer: Self-pay | Admitting: *Deleted

## 2014-03-20 ENCOUNTER — Telehealth: Payer: Self-pay | Admitting: Internal Medicine

## 2014-03-20 NOTE — Telephone Encounter (Signed)
Your cholesterol, A1c,  Thyroid,  iver and kidney function are normal.  You do not need any medication changes. Please plan to repeat the labs in 6 months.    Regards,   Dr. Derrel Nip

## 2014-03-20 NOTE — Telephone Encounter (Signed)
Letter mailed

## 2014-03-20 NOTE — Telephone Encounter (Signed)
Left message for pt to return my call.

## 2014-03-20 NOTE — Telephone Encounter (Signed)
Pt notified and verbalized understanding.

## 2014-04-06 ENCOUNTER — Telehealth: Payer: Self-pay | Admitting: Internal Medicine

## 2014-04-06 MED ORDER — LOSARTAN POTASSIUM 100 MG PO TABS: 100.0000 mg | ORAL_TABLET | Freq: Every day | ORAL | Status: DC

## 2014-04-06 NOTE — Telephone Encounter (Signed)
Patient called for refill on losartan to be sent to [place] refill has been sent as requested.

## 2014-04-27 ENCOUNTER — Other Ambulatory Visit: Payer: Self-pay | Admitting: *Deleted

## 2014-04-27 MED ORDER — ERGOCALCIFEROL 1.25 MG (50000 UT) PO CAPS: 50000.0000 [IU] | ORAL_CAPSULE | ORAL | Status: DC

## 2014-04-27 NOTE — Progress Notes (Signed)
Script sent to pharmacy per request of patient to [facility].

## 2014-04-28 ENCOUNTER — Other Ambulatory Visit: Payer: Self-pay | Admitting: Internal Medicine

## 2014-06-01 LAB — HM DIABETES EYE EXAM

## 2014-06-07 NOTE — Discharge Summary (Signed)
PATIENT NAME:  Kaitlin Edwards, Kaitlin Edwards MR#:  [id number] DATE OF BIRTH:  [birth year]  DATE OF ADMISSION:  07/10/2011 DATE OF DISCHARGE:  07/11/2011  ADMITTING DIAGNOSES:  1. Elevated blood pressure.  2. Epistaxis.  3. Hypoglycemia.  4. Abnormal EKG.  5. Glaucoma.   DISCHARGE DIAGNOSES:  1. Accelerated hypertension, now improved.  2. Diabetes with hypoglycemia at nighttime. The patient's evening dose of glipizide will be discontinued.  CONSULTANTS: None.   PERTINENT LABS AND EVALUATIONS: Chest x-ray showed a density in the left midlung likely secondary to fibrosis, also fibrotic changes present bilaterally. Heart size is normal.   EKG showed sinus tachycardia with occasional PVCs, possible left atrial enlargement, left axis deviation, nonspecific intraventricular conduction block.   Total cholesterol 217, triglycerides 59, HDL 83, LDL 122. CPK 82. CK-MB 2.0. Troponin 0.03. Total CPK 79. Troponin was 0.04. Glucose 197, BUN 17, creatinine 0.80, sodium 137, potassium 3.8, chloride 104, and bilirubin was slightly elevated at 1.1, otherwise LFTs normal. TSH 1.68.   HOSPITAL COURSE: The patient is an 81 year old white female with history of hypertension and diabetes who presented to the ED with elevated blood pressure. The patient apparently the day prior had an episode of epistaxis and was noted to have elevated blood pressure. Therefore, she was sent to the ED. In the ED, blood pressure was 220/102. She received a dose of labetalol and her blood pressure decreased to 176/95. We were asked to admit the patient. The patient was placed on p.r.n. blood pressure medications as well as metoprolol was added. Her blood pressure has improved today, still completely not normal. The patient does report that she has been having some increased stress recently which may have contributed to her current elevated blood pressure. She otherwise had no other symptoms. She has not had any further epistaxis. The patient's blood  glucose overnight dropped to 32 and this morning is 152. Otherwise, she is stable for discharge.   DISCHARGE MEDICATIONS:  1. Tramadol 50 mg every 6 hours p.r.n. 2. Losartan 100 mg 1 tab p.o. daily.  3. Aspirin 81 mg one tab p.o. daily.  4. Calcium plus vitamin D 1 tab p.o. daily.  5. Fish oil 1000 mg daily.  6. Docusate 100 mg one tab p.o. daily.  7. Tylenol PM at bedtime.  8. Azopt 1% ophthalmic, one drop to each affected eye twice a day. 9. Vitamin B12 1000 mcg monthly.  10. Metoprolol 50 mg p.o. every 12 hours. 11. Metoprolol tartrate 50 mg p.o. every 12 hours. 12. Glipizide 5 mg p.o. every a.m.   HOME OXYGEN: None.   DIET: Low sodium, ADA diet.   ACTIVITY: As tolerated.   TIMEFRAME FOR FOLLOW-UP: Followup in 1 to 2 weeks with Dr. Derrel Nip. Followup with [facility] for abnormal EKG in 3 to 5 weeks. Nurse to check blood pressure in the next few days daily.   TIME SPENT: 35 minutes. ____________________________ Lafonda Mosses Posey Pronto, MD shp:slb D: 07/11/2011 11:51:02 ET     T: 07/11/2011 16:38:14 ET       JOB#: [id number] cc: Jahree Dermody H. Posey Pronto, MD, <Dictator> Deborra Medina, MD Alric Seton MD ELECTRONICALLY SIGNED 07/13/2011 13:38

## 2014-06-07 NOTE — H&P (Signed)
PATIENT NAME:  Kaitlin Edwards, Kaitlin Edwards MR#:  [id number] DATE OF BIRTH:  1920/06/16  DATE OF ADMISSION:  07/10/2011  PRIMARY CARE PHYSICIAN: Dr. Derrel Nip  REFERRING PHYSICIAN:  Dr. Ulice Brilliant   CHIEF COMPLAINT: High blood pressure.   HISTORY OF PRESENT ILLNESS: 81 year old Caucasian female with a history of hypertension and diabetes who presented to the ED with high blood pressure. The patient is alert, awake, oriented, in no acute distress. The patient lives in a facility.  She had epistaxis two days ago so the nurse at the facility measured her blood pressure, which was more than 200.  The patient complains of generalized weakness and fatigue but denies any chest pain, palpitations, orthopnea, or nocturnal dyspnea. No leg edema. No cough, sputum, or shortness of breath. No weight gain. No weight loss. The patient came to the ED and blood pressure was 220/102. She was treated with labetalol. Blood pressure decreased to 176/95 now. However, the patient's EKG shows left bundle branch block and tachycardia with PVCs. Troponin level was 0.04.   PAST MEDICAL HISTORY:  1. Hypertension.  2. Diabetes.   SOCIAL HISTORY: No smoking or drinking or illicit drugs.  Lives alone.   FAMILY HISTORY: Hypertension, diabetes, but no heart attack or stroke.   PAST SURGICAL HISTORY: None.   ALLERGIES:  No known drug allergies.  MEDICATIONS:  1. Aspirin 81 mg p.o. daily.  2. Azopt 1% ophthalmic suspension, one drop each eye twice daily.  3. Calcium 600 plus D, 600 mg/200 international units, 1 tablet p.o. daily.  4. Colace 100 mg p.o. daily.  5. Fish oil 1000 mg p.o. daily.  6. Gas Relief 1 to 2 tablets p.o. daily. 7. Glipizide 5 mg p.o., 1 tablet in the morning, half tablet at dinner. 8. I-cap 1 tablet p.o. daily.  9. Losartan 100 mg p.o. daily.  10. Tramadol 50 mg p.o. q. 6 h. p.r.n.  11. Tylenol 1 tablet p.r.n. at bedtime.  12. Vitamin B12 1000 mcg, one injectable once a month.  REVIEW OF SYSTEMS: CONSTITUTIONAL:  The patient denies any fever or chills. No headache or dizziness, but has generalized weakness. No weight loss or weight gain. EYES: No double vision, blurred vision, but has history of glaucoma and macular degenerative disease.  ENT: Positive for epistaxis two days ago but no postnasal drip, hearing loss, or ear pain. No dysphagia or slurred speech. RESPIRATORY: No cough, sputum, shortness of breath, or hemoptysis. GASTROINTESTINAL: No abdominal pain, nausea, vomiting, or diarrhea. No melena, but bloody stool. GENITOURINARY: No dysuria or hematuria. CARDIOVASCULAR: No chest pain, palpitations, or orthopnea. No nocturnal dyspnea. No leg edema. ENDOCRINE: No polyuria, polydipsia, or heat or cold intolerance. HEMATOLOGIC: No easy bruising or bleeding but had epistaxis once. NEUROLOGIC: No syncope, loss of consciousness, or seizure.  PSYCH: No anxiety or depression.   PHYSICAL EXAMINATION:  VITAL SIGNS: Temperature 97.3. Blood pressure 176/95, pulse 90, oxygen saturation 96% on room air, respirations 22.   GENERAL: The patient is alert, awake, oriented, in no acute distress.   HEENT: Pupils are round, equal, reactive to light and accommodation. Moist oral mucosa. Clear oropharynx.  NECK: Supple. No JVD or carotid bruits. No lymphadenopathy. No thyromegaly.    CARDIOVASCULAR: S1, S2, regular rate and rhythm. No murmurs or gallops.   LUNGS: Bilateral air entry. No wheezing or rales.   ABDOMEN: Soft. No distention or tenderness. No organomegaly. Bowel sounds present.   EXTREMITIES: No edema, clubbing, or cyanosis. No calf tenderness. Bilateral strong pedal pulses.    SKIN: No  rash or jaundice.   NEUROLOGY: Alert and oriented times three.  No focal deficits. Power five out of five. Sensation intact. Deep tendon reflexes 2+.   LABORATORY, DIAGNOSTIC, AND RADIOLOGICAL DATA: Chest x-ray: There is a density in the left mid lung field likely secondary to fibrosis. EKG shows sinus tachycardia with  occasional premature ventricular contraction,  left axis deviation with left bundle branch block at 111 beats per minute. CK 79, CK-MB 1.8. Troponin 0.04. CBC normal. Glucose 197, BUN 17, creatinine 0.8. Electrolytes are normal. Magnesium 1.9. TSH 1.68.   IMPRESSION:  1. Accelerated hypertension.  2. Abnormal EKG.  3. Diabetes.  4. Epistaxis.   PLAN OF TREATMENT:  1. The patient will be admitted to the telemetry floor. We will continue aspirin and follow up troponin level.  2. We will get echocardiogram and a cardiology consult for abnormal EKG.  3. For accelerated hypertension, we will continue Losartan. We will add Lopressor and check lipid panel and hemoglobin A.  4. GI and deep vein thrombosis prophylaxis.  5. We will give labetalol p.r.n. if blood pressure systolic is more than 887 or diastolic more than 195.  We discussed the patient's situation and the plan of treatment with the patient and the patient's son.   TIME SPENT: About 55 minutes.   ____________________________ Demetrios Loll, MD qc:bjt D:  07/10/2011 13:35:43 ET         T: 07/10/2011 14:50:31 ET         JOB#: [id number]  cc: Deborra Medina, MD Demetrios Loll MD ELECTRONICALLY SIGNED 07/12/2011 15:27

## 2014-06-17 ENCOUNTER — Other Ambulatory Visit: Payer: Self-pay | Admitting: Internal Medicine

## 2014-06-17 NOTE — Telephone Encounter (Signed)
Last OV 1.20.16.  Please advise refill

## 2014-06-18 NOTE — Telephone Encounter (Signed)
Ok to refill,  printed rx  

## 2014-06-19 NOTE — Telephone Encounter (Signed)
Rx faxed to [facility] 

## 2014-07-30 ENCOUNTER — Ambulatory Visit (INDEPENDENT_AMBULATORY_CARE_PROVIDER_SITE_OTHER): Payer: Commercial Managed Care - HMO | Admitting: Cardiovascular Disease

## 2014-07-30 ENCOUNTER — Encounter: Payer: Self-pay | Admitting: Cardiovascular Disease

## 2014-07-30 VITALS — BP 168/98 | HR 75 | Ht 63.0 in | Wt 112.5 lb

## 2014-07-30 DIAGNOSIS — I5022 Chronic systolic (congestive) heart failure: Secondary | ICD-10-CM | POA: Diagnosis not present

## 2014-07-30 DIAGNOSIS — I10 Essential (primary) hypertension: Secondary | ICD-10-CM

## 2014-07-30 DIAGNOSIS — I447 Left bundle-branch block, unspecified: Secondary | ICD-10-CM

## 2014-07-30 MED ORDER — NEBIVOLOL HCL 20 MG PO TABS: 20.0000 mg | ORAL_TABLET | Freq: Every day | ORAL | Status: DC

## 2014-07-30 NOTE — Assessment & Plan Note (Signed)
The patient has cardiomyopathy with an ejection fraction of 40% in the setting of uncontrolled hypertension likely due to hypertensive heart disease. She continues to be in [place] class II and appears to be euvolemic. Due to her age, lack of significant symptoms related to this, no ischemic cardiac evaluation was pursued.  Continue treatment with losartan and Bystolic.

## 2014-07-30 NOTE — Patient Instructions (Signed)
Medication Instructions:  Your physician has recommended you make the following change in your medication:  INCREASE Bystolic to 20mg  daily   Labwork: none  Testing/Procedures: none  Follow-Up: Your physician recommends that you schedule a follow-up appointment in: three months with Dr. Fletcher Anon.    Any Other Special Instructions Will Be Listed Below (If Applicable). Monitor your blood pressure daily. Keep a log of each blood pressure reading and bring it with you to your next appointment.

## 2014-07-30 NOTE — Assessment & Plan Note (Signed)
Blood pressure continues to be elevated. Thus, I elected to increase the dose of Bystolic to 20 mg once daily. I asked her to monitor her blood pressure daily and bring blood pressure readings to her next Follow-up visit in 3 months.

## 2014-07-30 NOTE — Progress Notes (Signed)
HPI  This is a 81 y.o. pleasant female who is here today for a followup visit regarding cardiomyopathy and LBBB . She was admitted to [facility] in May of 2013 for accelerated hypertension with epistaxis. She had an echocardiogram done which showed an ejection fraction of 40% with septal motion abnormalities suggestive of conduction disease as well as septal and distal anterior hypokinesis with evidence of moderate pulmonary hypertension.  She did not tolerate metoprolol or carvedilol due to fatigue . She has been tolerating Bystolic. Overall, she has been doing well with no chest pain or dyspnea.  She continues to exercise regularly without significant limitations. She is suffering from macular degeneration and stopped driving for that reason. Her blood pressure continues to be elevated. She is a resident at the [facility] and she gets her blood pressure checked there and she does not remember the numbers.   No Known Allergies   Current Outpatient Prescriptions on File Prior to Visit  Medication Sig Dispense Refill  . ACCU-CHEK SOFTCLIX LANCETS lancets 1 each by Other route daily. Use as instructed 100 each 3  . brinzolamide (AZOPT) 1 % ophthalmic suspension 1 drop 2 (two) times daily.      . cyanocobalamin (,VITAMIN B-12,) 1000 MCG/ML injection Inject 1 mL (1,000 mcg total) into the muscle once. I ml IM injection monthly 10 mL 2  . diphenhydramine-acetaminophen (TYLENOL PM) 25-500 MG TABS Take 1 tablet by mouth at bedtime as needed.    . docusate sodium (COLACE) 100 MG capsule Take 100 mg by mouth daily.     . ergocalciferol (DRISDOL) 50000 UNITS capsule Take 1 capsule (50,000 Units total) by mouth once a week. 12 capsule 1  . fish oil-omega-3 fatty acids 1000 MG capsule Take 1,000 mg by mouth as needed.     Marland Kitchen glipiZIDE (GLUCOTROL) 5 MG tablet Take 1 tablet in the am and 1/2 tablet in the pm. (Patient taking differently: Take 5 mg by mouth daily before breakfast. ) 135 tablet 1  .  glucose blood (ACCU-CHEK AVIVA) test strip Check blood sugar twice daily Dx 250.00 50 each 5  . losartan (COZAAR) 100 MG tablet Take 1 tablet (100 mg total) by mouth daily. 90 tablet 1  . Multiple Vitamins-Minerals (ICAPS PO) Take by mouth 2 (two) times daily.     . nebivolol (BYSTOLIC) 10 MG tablet Take 1 tablet (10 mg total) by mouth daily. 90 tablet 0  . Tdap (BOOSTRIX) 5-2.5-18.5 LF-MCG/0.5 injection Inject 0.5 mLs into the muscle once. 0.5 mL 0  . traMADol (ULTRAM) 50 MG tablet TAKE 1 TABLET EVERY 6 HOURS AS NEEDED 90 tablet 3   No current facility-administered medications on file prior to visit.     Past Medical History  Diagnosis Date  . Diabetes mellitus     diet controlled  . Glaucoma   . Hypertension      Past Surgical History  Procedure Laterality Date  . Cataract extraction       Family History  Problem Relation Age of Onset  . Parkinsonism Mother   . Hypertension Brother      History   Social History  . Marital Status: Married    Spouse Name: N/A  . Number of Children: N/A  . Years of Education: N/A   Occupational History  . Not on file.   Social History Main Topics  . Smoking status: Never Smoker   . Smokeless tobacco: Never Used  . Alcohol Use: Yes  Comment: occasional-wine  . Drug Use: No  . Sexual Activity: Not on file   Other Topics Concern  . Not on file   Social History Narrative      PHYSICAL EXAM   BP 168/98 mmHg  Pulse 75  Ht 5\' 3"  (1.6 m)  Wt 112 lb 8 oz (51.03 kg)  BMI 19.93 kg/m2 Constitutional: She is oriented to person, place, and time. She appears well-developed and well-nourished. No distress.  HENT: No nasal discharge.  Head: Normocephalic and atraumatic.  Eyes: Pupils are equal and round. Right eye exhibits no discharge. Left eye exhibits no discharge.  Neck: Normal range of motion. Neck supple. No JVD present. No thyromegaly present.  Cardiovascular: Normal rate, regular rhythm, normal heart sounds. Exam  reveals no gallop and no friction rub. No murmur heard.  Pulmonary/Chest: Effort normal and breath sounds normal. No stridor. No respiratory distress. She has no wheezes. She has no rales. She exhibits no tenderness.  Abdominal: Soft. Bowel sounds are normal. She exhibits no distension. There is no tenderness. There is no rebound and no guarding.  Musculoskeletal: Normal range of motion. She exhibits no edema and no tenderness.  Neurological: She is alert and oriented to person, place, and time. Coordination normal.  Skin: Skin is warm and dry. No rash noted. She is not diaphoretic. No erythema. No pallor.  Psychiatric: She has a normal mood and affect. Her behavior is normal. Judgment and thought content normal.     EKG: Normal sinus rhythm  -First degree A-V block  P:QRS - 1:1, Abnormal P axis, H Rate 75  PRi = 238 -Left bundle branch block and left axis.   ABNORMAL     ASSESSMENT AND PLAN

## 2014-07-30 NOTE — Assessment & Plan Note (Signed)
She has underlying first degree AV block. No symptoms of dizziness, syncope or presyncope.

## 2014-08-14 ENCOUNTER — Ambulatory Visit (INDEPENDENT_AMBULATORY_CARE_PROVIDER_SITE_OTHER): Payer: Commercial Managed Care - HMO | Admitting: Internal Medicine

## 2014-08-14 ENCOUNTER — Encounter: Payer: Self-pay | Admitting: Internal Medicine

## 2014-08-14 VITALS — BP 164/80 | HR 76 | Temp 98.2°F | Resp 14 | Ht 63.0 in | Wt 109.5 lb

## 2014-08-14 DIAGNOSIS — R059 Cough, unspecified: Secondary | ICD-10-CM | POA: Insufficient documentation

## 2014-08-14 DIAGNOSIS — E1169 Type 2 diabetes mellitus with other specified complication: Secondary | ICD-10-CM | POA: Diagnosis not present

## 2014-08-14 DIAGNOSIS — E119 Type 2 diabetes mellitus without complications: Secondary | ICD-10-CM | POA: Diagnosis not present

## 2014-08-14 DIAGNOSIS — I5022 Chronic systolic (congestive) heart failure: Secondary | ICD-10-CM

## 2014-08-14 DIAGNOSIS — R05 Cough: Secondary | ICD-10-CM | POA: Insufficient documentation

## 2014-08-14 DIAGNOSIS — I10 Essential (primary) hypertension: Secondary | ICD-10-CM

## 2014-08-14 DIAGNOSIS — E785 Hyperlipidemia, unspecified: Secondary | ICD-10-CM

## 2014-08-14 DIAGNOSIS — F0150 Vascular dementia without behavioral disturbance: Secondary | ICD-10-CM

## 2014-08-14 NOTE — Patient Instructions (Addendum)
Your blood pressure is still too high. We are adding a third medication for your blood pressure.  It is called amlodipine 5 mg, and  it is taken once daily along with your losartan 100 ng and your bystolic 20 mg   You have lost weight.  I want you to drink the  Supplemental drink every day in addition to your three meals,.  Please also have a snack at night (cheee or penaut butter)   Your recent coughing illness might have been whooping cough,  So we will check your antibody level to see if it was .

## 2014-08-14 NOTE — Progress Notes (Signed)
Subjective:  Patient ID: Kaitlin Edwards, female    DOB: 1920/03/29  Age: 81 y.o. MRN: [id number]  CC: The primary encounter diagnosis was Chronic systolic congestive heart failure. Diagnoses of Type 2 diabetes mellitus, controlled, Hyperlipidemia associated with type 2 diabetes mellitus, Cough, Essential hypertension, and Vascular dementia, uncomplicated were also pertinent to this visit.  HPI Kaitlin Edwards presents for follow up on Type 2 DM, vascular dementia and  Hypertension.  Last seen in January.  Medication changes recently : bystolic rwas increased on Jun 16 th by Dr Fletcher Anon for uncontrolled hypertension;  patient has only been taking it at increased dose for the past 6 or 7 days.  Patient is very confused today about her medications and recent changes. She now receives assistance with medications which is managed by weekly filling of a pill box by [name] at [facility].  Patient self administers her medications from the pillbox, as she continues to live independently.   She no longer checks her blood sugars .  She is appropriately dressed and has no complaints today. She states that her appetite is fine, but records indicate ongoing weight loss and only eas one meal at the dining hall daily .   Outpatient Prescriptions Prior to Visit  Medication Sig Dispense Refill  . ACCU-CHEK SOFTCLIX LANCETS lancets 1 each by Other route daily. Use as instructed 100 each 3  . brinzolamide (AZOPT) 1 % ophthalmic suspension 1 drop 2 (two) times daily.      . cyanocobalamin (,VITAMIN B-12,) 1000 MCG/ML injection Inject 1 mL (1,000 mcg total) into the muscle once. I ml IM injection monthly 10 mL 2  . diphenhydramine-acetaminophen (TYLENOL PM) 25-500 MG TABS Take 1 tablet by mouth at bedtime as needed.    . docusate sodium (COLACE) 100 MG capsule Take 100 mg by mouth daily.     . ergocalciferol (DRISDOL) 50000 UNITS capsule Take 1 capsule (50,000 Units total) by mouth once a week. 12 capsule 1  . fish  oil-omega-3 fatty acids 1000 MG capsule Take 1,000 mg by mouth as needed.     Marland Kitchen glipiZIDE (GLUCOTROL) 5 MG tablet Take 1 tablet in the am and 1/2 tablet in the pm. (Patient taking differently: Take 5 mg by mouth daily before breakfast. ) 135 tablet 1  . glucose blood (ACCU-CHEK AVIVA) test strip Check blood sugar twice daily Dx 250.00 50 each 5  . losartan (COZAAR) 100 MG tablet Take 1 tablet (100 mg total) by mouth daily. 90 tablet 1  . Multiple Vitamins-Minerals (ICAPS PO) Take by mouth 2 (two) times daily.     . Nebivolol HCl 20 MG TABS Take 1 tablet (20 mg total) by mouth daily. 90 tablet 3  . Tdap (BOOSTRIX) 5-2.5-18.5 LF-MCG/0.5 injection Inject 0.5 mLs into the muscle once. 0.5 mL 0  . traMADol (ULTRAM) 50 MG tablet TAKE 1 TABLET EVERY 6 HOURS AS NEEDED 90 tablet 3   No facility-administered medications prior to visit.    Review of Systems;  Patient denies headache, fevers, malaise, unintentional weight loss, skin rash, eye pain, sinus congestion and sinus pain, sore throat, dysphagia,  hemoptysis , cough, dyspnea, wheezing, chest pain, palpitations, orthopnea, edema, abdominal pain, nausea, melena, diarrhea, constipation, flank pain, dysuria, hematuria, urinary  Frequency, nocturia, numbness, tingling, seizures,  Focal weakness, Loss of consciousness,  Tremor, insomnia, depression, anxiety, and suicidal ideation.      Objective:  BP 164/80 mmHg  Pulse 76  Temp(Src) 98.2 F (36.8 C) (Oral)  Resp 14  Ht 5\' 3"  (1.6 m)  Wt 109 lb 8 oz (49.669 kg)  BMI 19.40 kg/m2  SpO2 98%  BP Readings from Last 3 Encounters:  08/14/14 164/80  07/30/14 168/98  03/04/14 158/82    Wt Readings from Last 3 Encounters:  08/14/14 109 lb 8 oz (49.669 kg)  07/30/14 112 lb 8 oz (51.03 kg)  03/04/14 116 lb 8 oz (52.844 kg)    General appearance: alert, cooperative and appears stated age Ears: normal TM's and external ear canals both ears Throat: lips, mucosa, and tongue normal; teeth and gums  normal Neck: no adenopathy, no carotid bruit, supple, symmetrical, trachea midline and thyroid not enlarged, symmetric, no tenderness/mass/nodules Back: symmetric, no curvature. ROM normal. No CVA tenderness. Lungs: clear to auscultation bilaterally Heart: regular rate and rhythm, S1, S2 normal, no murmur, click, rub or gallop Abdomen: soft, non-tender; bowel sounds normal; no masses,  no organomegaly Pulses: 2+ and symmetric Skin: Skin color, texture, turgor normal. No rashes or lesions Lymph nodes: Cervical, supraclavicular, and axillary nodes normal.  Lab Results  Component Value Date   HGBA1C 7.1* 08/14/2014   HGBA1C 7.0* 03/12/2014   HGBA1C 6.5* 12/04/2013    Lab Results  Component Value Date   CREATININE 0.83 08/14/2014   CREATININE 0.7 03/12/2014   CREATININE 0.9 12/04/2013    Lab Results  Component Value Date   WBC 9.1 08/29/2013   HGB 12.6 08/29/2013   HCT 40.3 07/10/2011   PLT 301 08/29/2013   GLUCOSE 135* 08/14/2014   CHOL 205* 08/14/2014   TRIG 67 08/14/2014   HDL 91 08/14/2014   LDLDIRECT 106* 08/14/2014   LDLCALC 101* 08/14/2014   ALT 25 08/14/2014   AST 19 08/14/2014   NA 136 08/14/2014   K 4.1 08/14/2014   CL 96 08/14/2014   CREATININE 0.83 08/14/2014   BUN 24* 08/14/2014   CO2 26 08/14/2014   TSH 1.56 12/04/2013   HGBA1C 7.1* 08/14/2014   MICROALBUR 1.3 12/17/2012    No results found.  Assessment & Plan:   Problem List Items Addressed This Visit      Unprioritized   Type 2 diabetes mellitus, controlled    Well-controlled on current medications. Given her age, aggressive control is contraindicated.  She is up-to-date on eye exams and his foot exam is normal.  .  She is on the appropriate medications.  Lab Results  Component Value Date   HGBA1C 7.1* 08/14/2014   Lab Results  Component Value Date   MICROALBUR 1.3 12/17/2012                  Relevant Orders   Comprehensive metabolic panel (Completed)   Hemoglobin A1c  (Completed)   Hypertension    Uncontrolled despite increase in Bystolic dose to 20 mg,  adding amldipine today .  Continue bystolic and losartan which are now both maximally dosed      Vascular dementia, uncomplicated    There has been  progression of her cognitive decline.  She is unable to manage her medications without assistance,  Her pill box is filled weekly by [name] at [facility].  She is well groomed, articulate but continues to lose weight with 13 lbs in the past year and 28 lbs total since 2012.    I suspect she is forgetting to eat and have encouraged her to take more meals at the [facility] and add supplemental drinks but will need to communicate these observations and suggestions to her children. She has no  POA designated in her chart and her husband passed several years ago.         Cough    She reports a recent respiratory illness that included a cough that lasted several weeks.  She did not seek medical attention at that time  Pertussis ab profile ordered.       Relevant Orders   Bordetella pertussis Ab IgG, IgA (Completed)   Chronic systolic congestive heart failure - Primary   Hyperlipidemia associated with type 2 diabetes mellitus   Relevant Orders   LDL cholesterol, direct (Completed)   Lipid panel (Completed)     A total of 40 minutes was spent with patient more than half of which was spent in counseling patient on the above mentioned issues , reviewing and explaining recent labs and imaging studies done, and coordination of care.  I am having Ms. Okubo maintain her fish oil-omega-3 fatty acids, brinzolamide, Multiple Vitamins-Minerals (ICAPS PO), diphenhydramine-acetaminophen, ACCU-CHEK SOFTCLIX LANCETS, glucose blood, cyanocobalamin, glipiZIDE, docusate sodium, Tdap, losartan, ergocalciferol, traMADol, and Nebivolol HCl.  No orders of the defined types were placed in this encounter.    There are no discontinued medications.  Follow-up: Return in about  4 weeks (around 09/11/2014).   Crecencio Mc, MD

## 2014-08-15 LAB — COMPREHENSIVE METABOLIC PANEL
ALK PHOS: 70 U/L (ref 39–117)
ALT: 25 U/L (ref 0–35)
AST: 19 U/L (ref 0–37)
Albumin: 4.3 g/dL (ref 3.5–5.2)
BUN: 24 mg/dL — ABNORMAL HIGH (ref 6–23)
CO2: 26 meq/L (ref 19–32)
CREATININE: 0.83 mg/dL (ref 0.50–1.10)
Calcium: 9.1 mg/dL (ref 8.4–10.5)
Chloride: 96 mEq/L (ref 96–112)
GLUCOSE: 135 mg/dL — AB (ref 70–99)
Potassium: 4.1 mEq/L (ref 3.5–5.3)
Sodium: 136 mEq/L (ref 135–145)
TOTAL PROTEIN: 7.3 g/dL (ref 6.0–8.3)
Total Bilirubin: 1 mg/dL (ref 0.2–1.2)

## 2014-08-15 LAB — HEMOGLOBIN A1C
Hgb A1c MFr Bld: 7.1 % — ABNORMAL HIGH (ref <5.7)
Mean Plasma Glucose: 157 mg/dL — ABNORMAL HIGH (ref <117)

## 2014-08-15 LAB — LIPID PANEL
CHOLESTEROL: 205 mg/dL — AB (ref 0–200)
HDL: 91 mg/dL (ref >=46)
LDL CALC: 101 mg/dL — AB (ref 0–99)
TRIGLYCERIDES: 67 mg/dL (ref <150)
Total CHOL/HDL Ratio: 2.3 Ratio
VLDL: 13 mg/dL (ref 0–40)

## 2014-08-15 LAB — LDL CHOLESTEROL, DIRECT: LDL DIRECT: 106 mg/dL — AB

## 2014-08-17 NOTE — Assessment & Plan Note (Signed)
Well-controlled on current medications. Given her age, aggressive control is contraindicated.  She is up-to-date on eye exams and his foot exam is normal.  .  She is on the appropriate medications.  Lab Results  Component Value Date   HGBA1C 7.1* 08/14/2014   Lab Results  Component Value Date   MICROALBUR 1.3 12/17/2012

## 2014-08-17 NOTE — Assessment & Plan Note (Signed)
She reports a recent respiratory illness that included a cough that lasted several weeks.  She did not seek medical attention at that time  Pertussis ab profile ordered.

## 2014-08-17 NOTE — Assessment & Plan Note (Signed)
Uncontrolled despite increase in Bystolic dose to 20 mg,  adding amldipine today .  Continue bystolic and losartan which are now both maximally dosed

## 2014-08-17 NOTE — Assessment & Plan Note (Addendum)
There has been  progression of her cognitive decline.  She is unable to manage her medications without assistance,  Her pill box is filled weekly by [name] at [facility].  She is well groomed, articulate but continues to lose weight with 13 lbs in the past year and 28 lbs total since 2012.    I suspect she is forgetting to eat and have encouraged her to take more meals at the [facility] and add supplemental drinks but will need to communicate these observations and suggestions to her children. She has no POA designated in her chart and her husband passed several years ago.

## 2014-08-18 ENCOUNTER — Encounter: Payer: Self-pay | Admitting: *Deleted

## 2014-08-19 ENCOUNTER — Other Ambulatory Visit: Payer: Self-pay | Admitting: Internal Medicine

## 2014-08-19 LAB — BORDETELLA PERTUSSIS AB IGG,IGA
FHA IGA: 41 [IU]/mL
FHA IgG: 37 IU/mL
PT IGA: 2 [IU]/mL
PT IgG: 11 IU/mL

## 2014-09-15 ENCOUNTER — Ambulatory Visit (INDEPENDENT_AMBULATORY_CARE_PROVIDER_SITE_OTHER): Payer: Commercial Managed Care - HMO | Admitting: Internal Medicine

## 2014-09-15 ENCOUNTER — Encounter: Payer: Self-pay | Admitting: Internal Medicine

## 2014-09-15 VITALS — BP 148/78 | HR 73 | Temp 97.9°F | Resp 14 | Ht 63.0 in | Wt 109.2 lb

## 2014-09-15 DIAGNOSIS — I10 Essential (primary) hypertension: Secondary | ICD-10-CM

## 2014-09-15 DIAGNOSIS — Z23 Encounter for immunization: Secondary | ICD-10-CM | POA: Diagnosis not present

## 2014-09-15 DIAGNOSIS — R634 Abnormal weight loss: Secondary | ICD-10-CM

## 2014-09-15 DIAGNOSIS — F0150 Vascular dementia without behavioral disturbance: Secondary | ICD-10-CM

## 2014-09-15 DIAGNOSIS — R05 Cough: Secondary | ICD-10-CM

## 2014-09-15 DIAGNOSIS — K5900 Constipation, unspecified: Secondary | ICD-10-CM | POA: Diagnosis not present

## 2014-09-15 DIAGNOSIS — R059 Cough, unspecified: Secondary | ICD-10-CM

## 2014-09-15 DIAGNOSIS — E119 Type 2 diabetes mellitus without complications: Secondary | ICD-10-CM

## 2014-09-15 NOTE — Progress Notes (Signed)
Pre-visit discussion using our clinic review tool. No additional management support is needed unless otherwise documented below in the visit note.  

## 2014-09-15 NOTE — Progress Notes (Signed)
Subjective:  Patient ID: Kaitlin Edwards, female    DOB: 1920/09/03  Age: 81 y.o. MRN: [id number]  CC: The primary encounter diagnosis was Constipation, unspecified constipation type. Diagnoses of Cough, Loss of weight, Need for prophylactic vaccination with combined diphtheria-tetanus-pertussis (DTP) vaccine, Type 2 diabetes mellitus, controlled, Vascular dementia, uncomplicated, and Essential hypertension were also pertinent to this visit.  HPI Kaitlin Edwards presents for follow up on uncontrolled HTN and DM complicated by dementia .  She is not sure she Is taking the nebivolol that was prescribed 3 weeks ago,  Since the RN [name] fills her pill box   Her cough OF SEVERAL WEEKS DURATION  has resolved .  Pertussis Ab screen was negative.  Weight loss stabilized,  Diet discussed, eats  Breakfast at home,  Lunch usually at home and is a sandwich.  [place] but usually does not eat the entire meal.   Outpatient Prescriptions Prior to Visit  Medication Sig Dispense Refill  . ACCU-CHEK SOFTCLIX LANCETS lancets 1 each by Other route daily. Use as instructed 100 each 3  . brinzolamide (AZOPT) 1 % ophthalmic suspension 1 drop 2 (two) times daily.      . cyanocobalamin (,VITAMIN B-12,) 1000 MCG/ML injection Inject 1 mL (1,000 mcg total) into the muscle once. I ml IM injection monthly 10 mL 2  . diphenhydramine-acetaminophen (TYLENOL PM) 25-500 MG TABS Take 1 tablet by mouth at bedtime as needed.    . docusate sodium (COLACE) 100 MG capsule Take 100 mg by mouth daily.     . fish oil-omega-3 fatty acids 1000 MG capsule Take 1,000 mg by mouth as needed.     Marland Kitchen glipiZIDE (GLUCOTROL) 5 MG tablet TAKE 1 TABLET EVERY MORNING  AND TAKE 1/2 TABLET EVERY EVENING 135 tablet 1  . glucose blood (ACCU-CHEK AVIVA) test strip Check blood sugar twice daily Dx 250.00 50 each 5  . losartan (COZAAR) 100 MG tablet Take 1 tablet (100 mg total) by mouth daily. 90 tablet 1  . Multiple  Vitamins-Minerals (ICAPS PO) Take by mouth 2 (two) times daily.     . traMADol (ULTRAM) 50 MG tablet TAKE 1 TABLET EVERY 6 HOURS AS NEEDED 90 tablet 3  . ergocalciferol (DRISDOL) 50000 UNITS capsule Take 1 capsule (50,000 Units total) by mouth once a week. (Patient not taking: Reported on 09/15/2014) 12 capsule 1  . Nebivolol HCl 20 MG TABS Take 1 tablet (20 mg total) by mouth daily. 90 tablet 3  . Tdap (BOOSTRIX) 5-2.5-18.5 LF-MCG/0.5 injection Inject 0.5 mLs into the muscle once. 0.5 mL 0   No facility-administered medications prior to visit.    Review of Systems;  Patient denies headache, fevers, malaise, unintentional weight loss, skin rash, eye pain, sinus congestion and sinus pain, sore throat, dysphagia,  hemoptysis , cough, dyspnea, wheezing, chest pain, palpitations, orthopnea, edema, abdominal pain, nausea, melena, diarrhea, constipation, flank pain, dysuria, hematuria, urinary  Frequency, nocturia, numbness, tingling, seizures,  Focal weakness, Loss of consciousness,  Tremor, insomnia, depression, anxiety, and suicidal ideation.      Objective:  BP 148/78 mmHg  Pulse 73  Temp(Src) 97.9 F (36.6 C) (Oral)  Resp 14  Ht 5\' 3"  (1.6 m)  Wt 109 lb 4 oz (49.555 kg)  BMI 19.36 kg/m2  SpO2 97%  BP Readings from Last 3 Encounters:  09/15/14 148/78  08/14/14 164/80  07/30/14 168/98    Wt Readings from Last 3 Encounters:  09/15/14 109 lb 4 oz (49.555 kg)  08/14/14  109 lb 8 oz (49.669 kg)  07/30/14 112 lb 8 oz (51.03 kg)    General appearance: alert, cooperative and appears stated age Ears: normal TM's and external ear canals both ears Throat: lips, mucosa, and tongue normal; teeth and gums normal Neck: no adenopathy, no carotid bruit, supple, symmetrical, trachea midline and thyroid not enlarged, symmetric, no tenderness/mass/nodules Back: symmetric, no curvature. ROM normal. No CVA tenderness. Lungs: clear to auscultation bilaterally Heart: regular rate and rhythm, S1, S2  normal, no murmur, click, rub or gallop Abdomen: soft, non-tender; bowel sounds normal; no masses,  no organomegaly Pulses: 2+ and symmetric Skin: Skin color, texture, turgor normal. No rashes or lesions Lymph nodes: Cervical, supraclavicular, and axillary nodes normal.  Lab Results  Component Value Date   HGBA1C 7.1* 08/14/2014   HGBA1C 7.0* 03/12/2014   HGBA1C 6.5* 12/04/2013    Lab Results  Component Value Date   CREATININE 0.83 08/14/2014   CREATININE 0.7 03/12/2014   CREATININE 0.9 12/04/2013    Lab Results  Component Value Date   WBC 9.1 08/29/2013   HGB 12.6 08/29/2013   HCT 40.3 07/10/2011   PLT 301 08/29/2013   GLUCOSE 135* 08/14/2014   CHOL 205* 08/14/2014   TRIG 67 08/14/2014   HDL 91 08/14/2014   LDLDIRECT 106* 08/14/2014   LDLCALC 101* 08/14/2014   ALT 25 08/14/2014   AST 19 08/14/2014   NA 136 08/14/2014   K 4.1 08/14/2014   CL 96 08/14/2014   CREATININE 0.83 08/14/2014   BUN 24* 08/14/2014   CO2 26 08/14/2014   TSH 1.56 12/04/2013   HGBA1C 7.1* 08/14/2014   MICROALBUR 1.3 12/17/2012    No results found.  Assessment & Plan:   Cough Resolved, with negative Pertussis antibody profile.  Chest x ray was done which showed stable COPD changes and no acute infiltrates or lymphadenopathy     Type 2 diabetes mellitus, controlled Well-controlled on current medications. Given her age, aggressive control is contraindicated.  She is up-to-date on eye exams and his foot exam is normal.  .  She is on the appropriate medications.  Lab Results  Component Value Date   HGBA1C 7.1* 08/14/2014   Lab Results  Component Value Date   MICROALBUR 1.3 12/17/2012                Vascular dementia, uncomplicated There has been  progression of her cognitive decline.  She is unable to manage her medications without assistance,  Her pill box is filled weekly by [name] at [facility], but patient is not sure the last changes were incorporated .  She  is well groomed, articulate but has lost weight with 28 lbs total since 2012.    I suspect she is forgetting to eat and have encouraged her to take more meals at the [facility] and add supplemental drinks but will need to communicate these observations and suggestions to her children.     Hypertension Improved control, but patient is not sure she has incorporated the increase in Bystolic dose to 20 mg.  Continue bystolic and losartan which are now both maximally dosed      Updated Medication List Outpatient Encounter Prescriptions as of 09/15/2014  Medication Sig  . ACCU-CHEK SOFTCLIX LANCETS lancets 1 each by Other route daily. Use as instructed  . brinzolamide (AZOPT) 1 % ophthalmic suspension 1 drop 2 (two) times daily.    . cyanocobalamin (,VITAMIN B-12,) 1000 MCG/ML injection Inject 1 mL (1,000 mcg total) into the  muscle once. I ml IM injection monthly  . diphenhydramine-acetaminophen (TYLENOL PM) 25-500 MG TABS Take 1 tablet by mouth at bedtime as needed.  . docusate sodium (COLACE) 100 MG capsule Take 100 mg by mouth daily.   . fish oil-omega-3 fatty acids 1000 MG capsule Take 1,000 mg by mouth as needed.   Marland Kitchen glipiZIDE (GLUCOTROL) 5 MG tablet TAKE 1 TABLET EVERY MORNING  AND TAKE 1/2 TABLET EVERY EVENING  . glucose blood (ACCU-CHEK AVIVA) test strip Check blood sugar twice daily Dx 250.00  . losartan (COZAAR) 100 MG tablet Take 1 tablet (100 mg total) by mouth daily.  . Multiple Vitamins-Minerals (ICAPS PO) Take by mouth 2 (two) times daily.   . traMADol (ULTRAM) 50 MG tablet TAKE 1 TABLET EVERY 6 HOURS AS NEEDED  . ergocalciferol (DRISDOL) 50000 UNITS capsule Take 1 capsule (50,000 Units total) by mouth once a week. (Patient not taking: Reported on 09/15/2014)  . Nebivolol HCl 20 MG TABS Take 1 tablet (20 mg total) by mouth daily.  . Tdap (BOOSTRIX) 5-2.5-18.5 LF-MCG/0.5 injection Inject 0.5 mLs into the muscle once.   No facility-administered encounter medications on file as of  09/15/2014.     I am having Ms. Hoben maintain her fish oil-omega-3 fatty acids, brinzolamide, Multiple Vitamins-Minerals (ICAPS PO), diphenhydramine-acetaminophen, ACCU-CHEK SOFTCLIX LANCETS, glucose blood, cyanocobalamin, docusate sodium, Tdap, losartan, ergocalciferol, traMADol, Nebivolol HCl, and glipiZIDE.  No orders of the defined types were placed in this encounter.    There are no discontinued medications.  Follow-up: Return in about 3 months (around 12/16/2014).   Crecencio Mc, MD

## 2014-09-15 NOTE — Patient Instructions (Addendum)
I recommend that you take Colace (docusate) 100 mg  Daily at bedtime for your constipation .  This is a stool softener that you can buy OTC   We have given you your tetanus and whooping cough vaccine   I would like you to get a chest x ray since you are still having a cough .  It can be done at the Hospital   If your cough continues and the chest x ray is normal,  We will try treating you for reflux   You should resume take the tylenol PM because it has benadryl  That will  Help dry up any post nasal drip that may be aggravating your cough   I would like to be able to discuss your health with your sons,  But I need your permission to do so and I need to have their contact information placed in your chart.

## 2014-09-16 ENCOUNTER — Ambulatory Visit
Admission: RE | Admit: 2014-09-16 | Discharge: 2014-09-16 | Disposition: A | Discharge status: Home / Self Care | Payer: Commercial Managed Care - HMO | Source: Ambulatory Visit | Attending: Internal Medicine | Admitting: Internal Medicine

## 2014-09-16 DIAGNOSIS — R634 Abnormal weight loss: Secondary | ICD-10-CM

## 2014-09-16 DIAGNOSIS — J449 Chronic obstructive pulmonary disease, unspecified: Secondary | ICD-10-CM | POA: Insufficient documentation

## 2014-09-16 DIAGNOSIS — R05 Cough: Secondary | ICD-10-CM

## 2014-09-16 DIAGNOSIS — R059 Cough, unspecified: Secondary | ICD-10-CM

## 2014-09-17 NOTE — Assessment & Plan Note (Signed)
Resolved, with negative Pertussis antibody profile.  Chest x ray was done which showed stable COPD changes and no acute infiltrates or lymphadenopathy

## 2014-09-17 NOTE — Assessment & Plan Note (Addendum)
There has been  progression of her cognitive decline.  She is unable to manage her medications without assistance,  Her pill box is filled weekly by [name] at [facility], but patient is not sure the last changes were incorporated .  She is well groomed, articulate but has lost weight with 28 lbs total since 2012.    I suspect she is forgetting to eat and have encouraged her to take more meals at the [facility] and add supplemental drinks but will need to communicate these observations and suggestions to her children.

## 2014-09-17 NOTE — Assessment & Plan Note (Signed)
Well-controlled on current medications. Given her age, aggressive control is contraindicated.  She is up-to-date on eye exams and his foot exam is normal.  .  She is on the appropriate medications.  Lab Results  Component Value Date   HGBA1C 7.1* 08/14/2014   Lab Results  Component Value Date   MICROALBUR 1.3 12/17/2012

## 2014-09-17 NOTE — Assessment & Plan Note (Signed)
Improved control, but patient is not sure she has incorporated the increase in Bystolic dose to 20 mg.  Continue bystolic and losartan which are now both maximally dosed

## 2014-10-15 ENCOUNTER — Telehealth: Payer: Self-pay | Admitting: *Deleted

## 2014-10-15 MED ORDER — AMLODIPINE BESYLATE 5 MG PO TABS: 5.0000 mg | ORAL_TABLET | Freq: Every day | ORAL | Status: DC

## 2014-10-15 NOTE — Telephone Encounter (Signed)
Kaitlin Edwards from [facility] called requesting a prescription for Amlodipine 5 mg be sent to [facility].  Review of chart no listing of Amlodipine, [name] states it is listed on pts 7.1.16 AVS to start medication.  Please advise

## 2014-10-15 NOTE — Telephone Encounter (Signed)
.  done and sent

## 2014-11-03 ENCOUNTER — Ambulatory Visit (INDEPENDENT_AMBULATORY_CARE_PROVIDER_SITE_OTHER): Payer: Commercial Managed Care - HMO | Admitting: Cardiovascular Disease

## 2014-11-03 ENCOUNTER — Encounter: Payer: Self-pay | Admitting: Cardiovascular Disease

## 2014-11-03 VITALS — BP 130/68 | HR 76 | Ht 63.0 in | Wt 110.5 lb

## 2014-11-03 DIAGNOSIS — I10 Essential (primary) hypertension: Secondary | ICD-10-CM

## 2014-11-03 DIAGNOSIS — I5022 Chronic systolic (congestive) heart failure: Secondary | ICD-10-CM

## 2014-11-03 NOTE — Assessment & Plan Note (Signed)
Blood pressure is now well controlled on current medications. 

## 2014-11-03 NOTE — Assessment & Plan Note (Signed)
She is doing very well overall with no signs of fluid overload. Continue medical therapy.

## 2014-11-03 NOTE — Patient Instructions (Signed)
Medication Instructions: Continue same medications.   Labwork: None.   Procedures/Testing: None.   Follow-Up: 6 months with Dr. Arida.   Any Additional Special Instructions Will Be Listed Below (If Applicable).   

## 2014-11-03 NOTE — Progress Notes (Signed)
She is doing very well overall     HPI  This is a 81 y.o. pleasant female who is here today for a followup visit regarding cardiomyopathy and LBBB . She was admitted to [facility] in May of 2013 for accelerated hypertension with epistaxis. She had an echocardiogram done which showed an ejection fraction of 40% with septal motion abnormalities suggestive of conduction disease as well as septal and distal anterior hypokinesis with evidence of moderate pulmonary hypertension.  She did not tolerate metoprolol or carvedilol due to fatigue . She has been tolerating Bystolic. Overall, she has been doing well with no chest pain or dyspnea.  She continues to exercise regularly without significant limitations. She is suffering from macular degeneration and stopped driving for that reason. During last visit, I increased the dose of Bystolic to 20 mg once daily due to elevated BP. Amlodipine was added by Dr. Derrel Nip in July.  She is doing well overall with no complaints today. Blood pressure has been more controlled. She is getting help organizing her medications by a nurse.   No Known Allergies   Current Outpatient Prescriptions on File Prior to Visit  Medication Sig Dispense Refill  . ACCU-CHEK SOFTCLIX LANCETS lancets 1 each by Other route daily. Use as instructed 100 each 3  . amLODipine (NORVASC) 5 MG tablet Take 1 tablet (5 mg total) by mouth daily. 90 tablet 1  . brinzolamide (AZOPT) 1 % ophthalmic suspension 1 drop 2 (two) times daily.      . cyanocobalamin (,VITAMIN B-12,) 1000 MCG/ML injection Inject 1 mL (1,000 mcg total) into the muscle once. I ml IM injection monthly 10 mL 2  . diphenhydramine-acetaminophen (TYLENOL PM) 25-500 MG TABS Take 1 tablet by mouth at bedtime as needed.    . docusate sodium (COLACE) 100 MG capsule Take 100 mg by mouth daily.     . ergocalciferol (DRISDOL) 50000 UNITS capsule Take 1 capsule (50,000 Units total) by mouth once a week. 12 capsule 1  . fish oil-omega-3 fatty  acids 1000 MG capsule Take 1,000 mg by mouth as needed.     Marland Kitchen glipiZIDE (GLUCOTROL) 5 MG tablet TAKE 1 TABLET EVERY MORNING  AND TAKE 1/2 TABLET EVERY EVENING 135 tablet 1  . glucose blood (ACCU-CHEK AVIVA) test strip Check blood sugar twice daily Dx 250.00 50 each 5  . losartan (COZAAR) 100 MG tablet Take 1 tablet (100 mg total) by mouth daily. 90 tablet 1  . Multiple Vitamins-Minerals (ICAPS PO) Take by mouth 2 (two) times daily.     . Nebivolol HCl 20 MG TABS Take 1 tablet (20 mg total) by mouth daily. 90 tablet 3  . Tdap (BOOSTRIX) 5-2.5-18.5 LF-MCG/0.5 injection Inject 0.5 mLs into the muscle once. 0.5 mL 0  . traMADol (ULTRAM) 50 MG tablet TAKE 1 TABLET EVERY 6 HOURS AS NEEDED 90 tablet 3   No current facility-administered medications on file prior to visit.     Past Medical History  Diagnosis Date  . Diabetes mellitus     diet controlled  . Glaucoma   . Hypertension      Past Surgical History  Procedure Laterality Date  . Cataract extraction       Family History  Problem Relation Age of Onset  . Parkinsonism Mother   . Hypertension Brother      Social History   Social History  . Marital Status: Married    Spouse Name: N/A  . Number of Children: N/A  . Years of Education: N/A  Occupational History  . Not on file.   Social History Main Topics  . Smoking status: Never Smoker   . Smokeless tobacco: Never Used  . Alcohol Use: Yes     Comment: occasional-wine  . Drug Use: No  . Sexual Activity: Not on file   Other Topics Concern  . Not on file   Social History Narrative      PHYSICAL EXAM   BP 130/68 mmHg  Pulse 76  Ht 5\' 3"  (1.6 m)  Wt 110 lb 8 oz (50.122 kg)  BMI 19.58 kg/m2 Constitutional: She is oriented to person, place, and time. She appears well-developed and well-nourished. No distress.  HENT: No nasal discharge.  Head: Normocephalic and atraumatic.  Eyes: Pupils are equal and round. Right eye exhibits no discharge. Left eye exhibits  no discharge.  Neck: Normal range of motion. Neck supple. No JVD present. No thyromegaly present.  Cardiovascular: Normal rate, regular rhythm, normal heart sounds. Exam reveals no gallop and no friction rub. No murmur heard.  Pulmonary/Chest: Effort normal and breath sounds normal. No stridor. No respiratory distress. She has no wheezes. She has no rales. She exhibits no tenderness.  Abdominal: Soft. Bowel sounds are normal. She exhibits no distension. There is no tenderness. There is no rebound and no guarding.  Musculoskeletal: Normal range of motion. She exhibits no edema and no tenderness.  Neurological: She is alert and oriented to person, place, and time. Coordination normal.  Skin: Skin is warm and dry. No rash noted. She is not diaphoretic. No erythema. No pallor.  Psychiatric: She has a normal mood and affect. Her behavior is normal. Judgment and thought content normal.        ASSESSMENT AND PLAN

## 2014-11-11 ENCOUNTER — Other Ambulatory Visit: Payer: Self-pay | Admitting: Internal Medicine

## 2014-11-20 ENCOUNTER — Telehealth: Payer: Self-pay | Admitting: *Deleted

## 2014-11-20 MED ORDER — NEBIVOLOL HCL 20 MG PO TABS: 20.0000 mg | ORAL_TABLET | Freq: Every day | ORAL | Status: DC

## 2014-11-20 NOTE — Telephone Encounter (Signed)
[name] from [facility] called requesting a Rx for [employer] 20mg . Needs rx sent to [facility]. [facility]. Please advise since medication not on med list.

## 2014-11-20 NOTE — Telephone Encounter (Signed)
nebivolol is bystolic,  FYI.Marland Kitchen  rx sent for 90 days to [facility]

## 2014-12-16 ENCOUNTER — Encounter: Payer: Self-pay | Admitting: Internal Medicine

## 2014-12-17 ENCOUNTER — Other Ambulatory Visit: Payer: Self-pay | Admitting: Internal Medicine

## 2014-12-31 ENCOUNTER — Telehealth: Payer: Self-pay | Admitting: Internal Medicine

## 2014-12-31 DIAGNOSIS — D229 Melanocytic nevi, unspecified: Secondary | ICD-10-CM

## 2014-12-31 NOTE — Telephone Encounter (Signed)
Pt called stating she needs a referral ok from the doctor. Pt made an appt with the dermatologist and she was told she needs a referral. Thank You!

## 2014-12-31 NOTE — Telephone Encounter (Signed)
Pt has black moles on her body that she would like to have check. It does not matter which dermatologist she is scheduled with

## 2014-12-31 NOTE — Telephone Encounter (Signed)
Referral is in process as requested 

## 2015-01-01 ENCOUNTER — Encounter: Payer: Self-pay | Admitting: Internal Medicine

## 2015-01-19 ENCOUNTER — Telehealth: Payer: Self-pay | Admitting: Internal Medicine

## 2015-01-19 NOTE — Telephone Encounter (Signed)
The patient had her flu shot done October 2016 at the [facility].

## 2015-01-20 ENCOUNTER — Ambulatory Visit (INDEPENDENT_AMBULATORY_CARE_PROVIDER_SITE_OTHER): Payer: Commercial Managed Care - HMO | Admitting: Family Medicine

## 2015-01-20 ENCOUNTER — Encounter: Payer: Self-pay | Admitting: Family Medicine

## 2015-01-20 VITALS — BP 128/74 | HR 79 | Temp 98.0°F | Ht 63.0 in | Wt 109.4 lb

## 2015-01-20 DIAGNOSIS — R5383 Other fatigue: Secondary | ICD-10-CM

## 2015-01-20 DIAGNOSIS — E119 Type 2 diabetes mellitus without complications: Secondary | ICD-10-CM | POA: Diagnosis not present

## 2015-01-20 LAB — BASIC METABOLIC PANEL
BUN: 23 mg/dL (ref 6–23)
CHLORIDE: 100 meq/L (ref 96–112)
CO2: 28 mEq/L (ref 19–32)
Calcium: 9.5 mg/dL (ref 8.4–10.5)
Creatinine, Ser: 0.94 mg/dL (ref 0.40–1.20)
GFR: 59.2 mL/min — ABNORMAL LOW (ref >60.00)
Glucose, Bld: 153 mg/dL — ABNORMAL HIGH (ref 70–99)
POTASSIUM: 4.4 meq/L (ref 3.5–5.1)
SODIUM: 135 meq/L (ref 135–145)

## 2015-01-20 LAB — HEMOGLOBIN A1C: Hgb A1c MFr Bld: 6.9 % — ABNORMAL HIGH (ref 4.6–6.5)

## 2015-01-20 NOTE — Assessment & Plan Note (Signed)
Previously controlled. We'll check an A1c and BMP today. Continue glipizide.

## 2015-01-20 NOTE — Assessment & Plan Note (Signed)
Much improved. Suspect this is related to her prior acute illness. She has no complaints of this at this time. She'll continue to monitor. If this worsens or returns or she develops any new symptoms she will come back and see [place]. PHQ 9 was negative for depression. Given return precautions.

## 2015-01-20 NOTE — Patient Instructions (Signed)
Nice to meet you. I am glad her feeling better at this time. We will check an A1c for your diabetes If you develop recurrent symptoms, or have chest pain, shortness of breath, fatigue, fevers, thoughts of harming herself or others, increasing depression, or any new or change in symptoms please seek medical attention.

## 2015-01-20 NOTE — Progress Notes (Signed)
Patient ID: Kaitlin Edwards, female   DOB: Sep 20, 1920, 81 y.o.   MRN: [id number]  Tommi Rumps, MD Phone: [phone]  Kaitlin Edwards is a 81 y.o. female who presents today for same-day visit.  Patient reports she comes in for follow-up of nasal congestion and sinus congestion. States she was seen once ago for this. She notes it took her a long time to get over it. She had minimal cough with this. She had noted chest pain, shortness of breath, or fevers with this. She reports she was tired most of the time as well with this. She notes that she felt a little down with this and gave up on doing lots of her activities with this. She notes over the last week she is improved and feels better. She has none of the above complaints now. She wanted to come in for a check up and make sure she was okay. Notes her weight is stable. No SI or HI.  DIABETES Disease Monitoring: Last A1c was 7.0 Polyuria/phagia/dipsia- no     Medications: Compliance- taking glipizide Hypoglycemic symptoms- no  PMH: nonsmoker.   ROS see history of present illness  Objective  Physical Exam Filed Vitals:   01/20/15 1032  BP: 128/74  Pulse: 79  Temp: 98 F (36.7 C)   Wt Readings from Last 3 Encounters:  01/20/15 109 lb 6.4 oz (49.624 kg)  11/03/14 110 lb 8 oz (50.122 kg)  09/15/14 109 lb 4 oz (49.555 kg)    Physical Exam  Constitutional: She is well-developed, well-nourished, and in no distress.  HENT:  Head: Normocephalic and atraumatic.  Right Ear: External ear normal.  Left Ear: External ear normal.  Mouth/Throat: Oropharynx is clear and moist. No oropharyngeal exudate.  Normal TMs bilaterally  Eyes: Conjunctivae are normal. Pupils are equal, round, and reactive to light.  Neck: Neck supple.  Cardiovascular: Normal rate, regular rhythm and normal heart sounds.  Exam reveals no gallop and no friction rub.   No murmur heard. Pulmonary/Chest: Effort normal and breath sounds normal. No respiratory  distress. She has no wheezes. She has no rales.  Lymphadenopathy:    She has no cervical adenopathy.  Neurological: She is alert. Gait normal.  Skin: Skin is warm and dry. She is not diaphoretic.     Assessment/Plan: Please see individual problem list.  Fatigue Much improved. Suspect this is related to her prior acute illness. She has no complaints of this at this time. She'll continue to monitor. If this worsens or returns or she develops any new symptoms she will come back and see [place]. PHQ 9 was negative for depression. Given return precautions.  Type 2 diabetes mellitus, controlled Previously controlled. We'll check an A1c and BMP today. Continue glipizide.  She was advised on diet to maintain her weight.  Orders Placed This Encounter  Procedures  . HgB A1c  . Basic Metabolic Panel (BMET)    Tommi Rumps

## 2015-01-20 NOTE — Progress Notes (Signed)
Pre visit review using our clinic review tool, if applicable. No additional management support is needed unless otherwise documented below in the visit note. 

## 2015-01-20 NOTE — Addendum Note (Signed)
Addended by: Leone Haven on: 01/20/2015 01:23 PM   Modules accepted: Orders, Medications

## 2015-01-27 ENCOUNTER — Other Ambulatory Visit: Payer: Self-pay | Admitting: Internal Medicine

## 2015-01-27 ENCOUNTER — Telehealth: Payer: Self-pay | Admitting: *Deleted

## 2015-01-27 NOTE — Telephone Encounter (Signed)
This is being worked on by another [employer].

## 2015-01-27 NOTE — Telephone Encounter (Signed)
Patient requested a medication refill for tramadol, to be sent to the [facility], patient is currently waiting on her mail order to arrive, and has ran out of medication.

## 2015-01-27 NOTE — Telephone Encounter (Signed)
Ok to refill,  printed rx  

## 2015-01-29 ENCOUNTER — Other Ambulatory Visit: Payer: Self-pay | Admitting: Internal Medicine

## 2015-01-29 NOTE — Telephone Encounter (Signed)
Tramadol trx was filled on 12/14 but will resend it .

## 2015-01-29 NOTE — Telephone Encounter (Signed)
Rx faxed as directed. 

## 2015-02-01 ENCOUNTER — Other Ambulatory Visit: Payer: Self-pay | Admitting: Internal Medicine

## 2015-02-16 ENCOUNTER — Ambulatory Visit (INDEPENDENT_AMBULATORY_CARE_PROVIDER_SITE_OTHER): Payer: PPO

## 2015-02-16 VITALS — BP 138/72 | HR 74 | Temp 97.4°F | Resp 14 | Ht 63.0 in | Wt 111.1 lb

## 2015-02-16 DIAGNOSIS — D1801 Hemangioma of skin and subcutaneous tissue: Secondary | ICD-10-CM | POA: Diagnosis not present

## 2015-02-16 DIAGNOSIS — L821 Other seborrheic keratosis: Secondary | ICD-10-CM | POA: Diagnosis not present

## 2015-02-16 DIAGNOSIS — Z0000 Encounter for general adult medical examination without abnormal findings: Secondary | ICD-10-CM | POA: Diagnosis not present

## 2015-02-16 NOTE — Progress Notes (Signed)
  I have reviewed the above information and agree with above.   Naydeen Speirs, MD 

## 2015-02-16 NOTE — Progress Notes (Signed)
Subjective:   Kaitlin Edwards is a 82 y.o. female who presents for an Initial Medicare Annual Wellness Visit.  Review of Systems    No ROS.  Medicare Wellness Visit.  Cardiac Risk Factors include: diabetes mellitus;hypertension     Objective:    Today's Vitals   02/16/15 1356  BP: 138/72  Pulse: 74  Temp: 97.4 F (36.3 C)  TempSrc: Oral  Resp: 14  Height: 5\' 3"  (1.6 m)  Weight: 111 lb 1.9 oz (50.404 kg)  SpO2: 98%    Current Medications (verified) Outpatient Encounter Prescriptions as of 02/16/2015  Medication Sig  . ACCU-CHEK SOFTCLIX LANCETS lancets 1 each by Other route daily. Use as instructed  . brinzolamide (AZOPT) 1 % ophthalmic suspension 1 drop 2 (two) times daily.    . cyanocobalamin (,VITAMIN B-12,) 1000 MCG/ML injection Inject 1 mL (1,000 mcg total) into the muscle once. I ml IM injection monthly  . docusate sodium (COLACE) 100 MG capsule Take 100 mg by mouth daily.   . fish oil-omega-3 fatty acids 1000 MG capsule Take 1,000 mg by mouth as needed.   Marland Kitchen glipiZIDE (GLUCOTROL) 5 MG tablet TAKE 1 TABLET EVERY MORNING  AND TAKE 1/2 TABLET EVERY EVENING  . glucose blood (ACCU-CHEK AVIVA) test strip Check blood sugar twice daily Dx 250.00  . losartan (COZAAR) 100 MG tablet TAKE 1 TABLET EVERY DAY  . Multiple Vitamins-Minerals (ICAPS PO) Take by mouth 2 (two) times daily.   . Nebivolol HCl 20 MG TABS Take 1 tablet (20 mg total) by mouth daily.  . traMADol (ULTRAM) 50 MG tablet TAKE 1 TABLET EVERY 6 HOURS AS NEEDED  . traMADol (ULTRAM) 50 MG tablet TAKE 1 TABLET EVERY 6 HOURS AS NEEDED  . Vitamin D, Ergocalciferol, (DRISDOL) 50000 UNITS CAPS capsule TAKE 1 CAPSULE ONE TIME WEEKLY   No facility-administered encounter medications on file as of 02/16/2015.    Allergies (verified) Review of patient's allergies indicates no known allergies.   History: Past Medical History  Diagnosis Date  . Diabetes mellitus     diet controlled  . Glaucoma   . Hypertension    Past  Surgical History  Procedure Laterality Date  . Cataract extraction     Family History  Problem Relation Age of Onset  . Parkinsonism Mother   . Hypertension Brother    Social History   Occupational History  . Not on file.   Social History Main Topics  . Smoking status: Never Smoker   . Smokeless tobacco: Never Used  . Alcohol Use: Yes     Comment: occasional-wine  . Drug Use: No  . Sexual Activity: Not Currently    Tobacco Counseling Counseling given: Not Answered   Activities of Daily Living In your present state of health, do you have any difficulty performing the following activities: 02/16/2015  Hearing? N  Vision? Y  Difficulty concentrating or making decisions? Y  Walking or climbing stairs? N  Dressing or bathing? N  Doing errands, shopping? Y  Preparing Food and eating ? N  Using the Toilet? N  In the past six months, have you accidently leaked urine? N  Do you have problems with loss of bowel control? N  Managing your Medications? Y  Managing your Finances? Y  Housekeeping or managing your Housekeeping? N    Immunizations and Health Maintenance Immunization History  Administered Date(s) Administered  . Influenza Split 11/30/2011, 12/05/2012, 11/13/2013  . Influenza-Unspecified 11/16/2014  . Pneumococcal Conjugate-13 04/23/2013  . Pneumococcal Polysaccharide-23 10/06/2011  .  Tdap 09/15/2014  . Zoster 04/23/2005   There are no preventive care reminders to display for this patient.  Patient Care Team: Crecencio Mc, MD as PCP - General (Internal Medicine)  Indicate any recent Medical Services you may have received from other than [facility] providers in the past year (date may be approximate).     Assessment:   This is a routine wellness examination for [place]. The goal of the wellness visit is to assist the patient how to close the gaps in care and create a preventative care plan for the patient.   VIT D Calcium as appropriate/ Osteoporosis risk  reviewed.  Medications reviewed; taking without issues or barriers.  Safety issues reviewed; smoke detectors in the home. No firearms in the home. Wears seatbelts when driving or riding with others. No violence in the home.  The patient was oriented x 3; appropriate in dress and manner and no objective failures at ADL's or IADL's.   DMII wo comp nt st u-stable and followe by PCP Type 2 diabetes mell-stable and followed by PCP CHF NOS-stable and followed by Dr. Fletcher Anon and PCP Chr systolic hrt failure-stable and followed by Dr. Fletcher Anon and PCP Chronic systolic-stable and followed by Dr. Fletcher Anon and PCP DMII oth nt st uncntrld-stable and followed by PCP Type 2 diabetes mellitus with other specified comp-stable and followed by PCP  Patient Concerns: Decrease in appetite for over a month; fears losing too much weight.  Deferred to PCP for follow up. Appointment scheduled.  Hearing/Vision screen Hearing Screening Comments: Passes the whisper test Vision Screening Comments: Follow[facility]  Glaucoma Macular Degeneration Wears glasses Visits every 3 months   Dietary issues and exercise activities discussed: Current Exercise Habits:: Structured exercise class (Water aerobics), Type of exercise: walking;calisthenics, Time (Minutes): 30, Frequency (Times/Week): 3, Weekly Exercise (Minutes/Week): 90, Intensity: Mild  Goals    . Healthy Lifestyle     Continue making healthy food choices eating 3 meals daily.  Drink plenty of water and maintain exercise regiment.      Depression Screen PHQ 2/9 Scores 02/16/2015 01/20/2015 09/17/2014 11/08/2013 12/17/2012  PHQ - 2 Score 0 0 0 0 0  PHQ- 9 Score 4 4 - - -    Fall Risk Fall Risk  02/16/2015 09/17/2014 11/08/2013 12/17/2012  Falls in the past year? No No No No    Cognitive Function: MMSE - Mini Mental State Exam 02/16/2015  Orientation to time 5  Orientation to Place 5  Registration 3  Attention/ Calculation 5  Recall 0    Recall-comments Could not recall 3/3 objects  Language- name 2 objects 2  Language- repeat 1  Language- follow 3 step command 3  Language- read & follow direction 1  Write a sentence 0  Write a sentence-comments Declined.  Hard to see to write sentence.  Copy design 0  Copy design-comments Declined. Hard to see design.  Total score 25    Screening Tests Health Maintenance  Topic Date Due  . OPHTHALMOLOGY EXAM  06/01/2015  . HEMOGLOBIN A1C  07/21/2015  . INFLUENZA VACCINE  09/14/2015  . FOOT EXAM  09/15/2015  . TETANUS/TDAP  09/14/2024  . DEXA SCAN  Completed  . ZOSTAVAX  Completed  . PNA vac Low Risk Adult  Completed      Plan:   End of life planning; Advance aging; Advanced directives discussed. Copy requested of current HCPOA/Living Will.   Return for follow up  During the course of the visit, Nyra was  educated and counseled about the following appropriate screening and preventive services:   Vaccines to include Pneumoccal, Influenza, Hepatitis B, Td, Zostavax, HCV  Electrocardiogram  Cardiovascular disease screening  Colorectal cancer screening  Bone density screening  Diabetes screening  Glaucoma screening  Mammography/PAP  Nutrition counseling  Smoking cessation counseling  Patient Instructions (the written plan) were given to the patient.    Varney Biles, LPN   [id number]

## 2015-02-16 NOTE — Patient Instructions (Addendum)
Kaitlin Edwards,  Thank you for taking time to come for your Medicare Wellness Visit.  I appreciate your ongoing commitment to your health goals. Please review the following plan we discussed and let me know if I can assist you in the future.  Check with your son and bring a copy of Advance Directives  Follow up with Dr.Tullo as needed.   Health Maintenance, Female Adopting a healthy lifestyle and getting preventive care can go a long way to promote health and wellness. Talk with your health care provider about what schedule of regular examinations is right for you. This is a good chance for you to check in with your provider about disease prevention and staying healthy. In between checkups, there are plenty of things you can do on your own. Experts have done a lot of research about which lifestyle changes and preventive measures are most likely to keep you healthy. Ask your health care provider for more information. WEIGHT AND DIET  Eat a healthy diet  Be sure to include plenty of vegetables, fruits, low-fat dairy products, and lean protein.  Do not eat a lot of foods high in solid fats, added sugars, or salt.  Get regular exercise. This is one of the most important things you can do for your health.  Most adults should exercise for at least 150 minutes each week. The exercise should increase your heart rate and make you sweat (moderate-intensity exercise).  Most adults should also do strengthening exercises at least twice a week. This is in addition to the moderate-intensity exercise.  Maintain a healthy weight  Body mass index (BMI) is a measurement that can be used to identify possible weight problems. It estimates body fat based on height and weight. Your health care provider can help determine your BMI and help you achieve or maintain a healthy weight.  For females 20 years of age and older:   A BMI below 18.5 is considered underweight.  A BMI of 18.5 to 24.9 is normal.  A BMI of  25 to 29.9 is considered overweight.  A BMI of 30 and above is considered obese.  Watch levels of cholesterol and blood lipids  You should start having your blood tested for lipids and cholesterol at 80 years of age, then have this test every 5 years.  You may need to have your cholesterol levels checked more often if:  Your lipid or cholesterol levels are high.  You are older than 80 years of age.  You are at high risk for heart disease.  CANCER SCREENING   Lung Cancer  Lung cancer screening is recommended for adults 55-80 years old who are at high risk for lung cancer because of a history of smoking.  A yearly low-dose CT scan of the lungs is recommended for people who:  Currently smoke.  Have quit within the past 15 years.  Have at least a 30-pack-year history of smoking. A pack year is smoking an average of one pack of cigarettes a day for 1 year.  Yearly screening should continue until it has been 15 years since you quit.  Yearly screening should stop if you develop a health problem that would prevent you from having lung cancer treatment.  Breast Cancer  Practice breast self-awareness. This means understanding how your breasts normally appear and feel.  It also means doing regular breast self-exams. Let your health care provider know about any changes, no matter how small.  If you are in your 20s or 30s, you   should have a clinical breast exam (CBE) by a health care provider every 1-3 years as part of a regular health exam.  If you are 40 or older, have a CBE every year. Also consider having a breast X-ray (mammogram) every year.  If you have a family history of breast cancer, talk to your health care provider about genetic screening.  If you are at high risk for breast cancer, talk to your health care provider about having an MRI and a mammogram every year.  Breast cancer gene (BRCA) assessment is recommended for women who have family members with BRCA-related  cancers. BRCA-related cancers include:  Breast.  Ovarian.  Tubal.  Peritoneal cancers.  Results of the assessment will determine the need for genetic counseling and BRCA1 and BRCA2 testing. Cervical Cancer Your health care provider may recommend that you be screened regularly for cancer of the pelvic organs (ovaries, uterus, and vagina). This screening involves a pelvic examination, including checking for microscopic changes to the surface of your cervix (Pap test). You may be encouraged to have this screening done every 3 years, beginning at age 21.  For women ages 30-65, health care providers may recommend pelvic exams and Pap testing every 3 years, or they may recommend the Pap and pelvic exam, combined with testing for human papilloma virus (HPV), every 5 years. Some types of HPV increase your risk of cervical cancer. Testing for HPV may also be done on women of any age with unclear Pap test results.  Other health care providers may not recommend any screening for nonpregnant women who are considered low risk for pelvic cancer and who do not have symptoms. Ask your health care provider if a screening pelvic exam is right for you.  If you have had past treatment for cervical cancer or a condition that could lead to cancer, you need Pap tests and screening for cancer for at least 20 years after your treatment. If Pap tests have been discontinued, your risk factors (such as having a new sexual partner) need to be reassessed to determine if screening should resume. Some women have medical problems that increase the chance of getting cervical cancer. In these cases, your health care provider may recommend more frequent screening and Pap tests. Colorectal Cancer  This type of cancer can be detected and often prevented.  Routine colorectal cancer screening usually begins at 80 years of age and continues through 80 years of age.  Your health care provider may recommend screening at an earlier  age if you have risk factors for colon cancer.  Your health care provider may also recommend using home test kits to check for hidden blood in the stool.  A small camera at the end of a tube can be used to examine your colon directly (sigmoidoscopy or colonoscopy). This is done to check for the earliest forms of colorectal cancer.  Routine screening usually begins at age 50.  Direct examination of the colon should be repeated every 5-10 years through 80 years of age. However, you may need to be screened more often if early forms of precancerous polyps or small growths are found. Skin Cancer  Check your skin from head to toe regularly.  Tell your health care provider about any new moles or changes in moles, especially if there is a change in a mole's shape or color.  Also tell your health care provider if you have a mole that is larger than the size of a pencil eraser.  Always use sunscreen.   Apply sunscreen liberally and repeatedly throughout the day.  Protect yourself by wearing long sleeves, pants, a wide-brimmed hat, and sunglasses whenever you are outside. HEART DISEASE, DIABETES, AND HIGH BLOOD PRESSURE   High blood pressure causes heart disease and increases the risk of stroke. High blood pressure is more likely to develop in:  People who have blood pressure in the high end of the normal range (130-139/85-89 mm Hg).  People who are overweight or obese.  People who are African American.  If you are 18-39 years of age, have your blood pressure checked every 3-5 years. If you are 40 years of age or older, have your blood pressure checked every year. You should have your blood pressure measured twice--once when you are at a hospital or clinic, and once when you are not at a hospital or clinic. Record the average of the two measurements. To check your blood pressure when you are not at a hospital or clinic, you can use:  An automated blood pressure machine at a pharmacy.  A home  blood pressure monitor.  If you are between 55 years and 79 years old, ask your health care provider if you should take aspirin to prevent strokes.  Have regular diabetes screenings. This involves taking a blood sample to check your fasting blood sugar level.  If you are at a normal weight and have a low risk for diabetes, have this test once every three years after 80 years of age.  If you are overweight and have a high risk for diabetes, consider being tested at a younger age or more often. PREVENTING INFECTION  Hepatitis B  If you have a higher risk for hepatitis B, you should be screened for this virus. You are considered at high risk for hepatitis B if:  You were born in a country where hepatitis B is common. Ask your health care provider which countries are considered high risk.  Your parents were born in a high-risk country, and you have not been immunized against hepatitis B (hepatitis B vaccine).  You have HIV or AIDS.  You use needles to inject street drugs.  You live with someone who has hepatitis B.  You have had sex with someone who has hepatitis B.  You get hemodialysis treatment.  You take certain medicines for conditions, including cancer, organ transplantation, and autoimmune conditions. Hepatitis C  Blood testing is recommended for:  Everyone born from 1945 through 1965.  Anyone with known risk factors for hepatitis C. Sexually transmitted infections (STIs)  You should be screened for sexually transmitted infections (STIs) including gonorrhea and chlamydia if:  You are sexually active and are younger than 80 years of age.  You are older than 80 years of age and your health care provider tells you that you are at risk for this type of infection.  Your sexual activity has changed since you were last screened and you are at an increased risk for chlamydia or gonorrhea. Ask your health care provider if you are at risk.  If you do not have HIV, but are at  risk, it may be recommended that you take a prescription medicine daily to prevent HIV infection. This is called pre-exposure prophylaxis (PrEP). You are considered at risk if:  You are sexually active and do not regularly use condoms or know the HIV status of your partner(s).  You take drugs by injection.  You are sexually active with a partner who has HIV. Talk with your health care provider about   whether you are at high risk of being infected with HIV. If you choose to begin PrEP, you should first be tested for HIV. You should then be tested every 3 months for as long as you are taking PrEP.  PREGNANCY   If you are premenopausal and you may become pregnant, ask your health care provider about preconception counseling.  If you may become pregnant, take 400 to 800 micrograms (mcg) of folic acid every day.  If you want to prevent pregnancy, talk to your health care provider about birth control (contraception). OSTEOPOROSIS AND MENOPAUSE   Osteoporosis is a disease in which the bones lose minerals and strength with aging. This can result in serious bone fractures. Your risk for osteoporosis can be identified using a bone density scan.  If you are 65 years of age or older, or if you are at risk for osteoporosis and fractures, ask your health care provider if you should be screened.  Ask your health care provider whether you should take a calcium or vitamin D supplement to lower your risk for osteoporosis.  Menopause may have certain physical symptoms and risks.  Hormone replacement therapy may reduce some of these symptoms and risks. Talk to your health care provider about whether hormone replacement therapy is right for you.  HOME CARE INSTRUCTIONS   Schedule regular health, dental, and eye exams.  Stay current with your immunizations.   Do not use any tobacco products including cigarettes, chewing tobacco, or electronic cigarettes.  If you are pregnant, do not drink alcohol.  If  you are breastfeeding, limit how much and how often you drink alcohol.  Limit alcohol intake to no more than 1 drink per day for nonpregnant women. One drink equals 12 ounces of beer, 5 ounces of wine, or 1 ounces of hard liquor.  Do not use street drugs.  Do not share needles.  Ask your health care provider for help if you need support or information about quitting drugs.  Tell your health care provider if you often feel depressed.  Tell your health care provider if you have ever been abused or do not feel safe at home.   This information is not intended to replace advice given to you by your health care provider. Make sure you discuss any questions you have with your health care provider.   Document Released: 08/15/2010 Document Revised: 02/20/2014 Document Reviewed: 01/01/2013 [employer] Interactive Patient Education 2016 [employer]  

## 2015-03-08 ENCOUNTER — Ambulatory Visit (INDEPENDENT_AMBULATORY_CARE_PROVIDER_SITE_OTHER): Payer: PPO | Admitting: Internal Medicine

## 2015-03-08 ENCOUNTER — Encounter: Payer: Self-pay | Admitting: Internal Medicine

## 2015-03-08 VITALS — BP 148/72 | HR 78 | Temp 98.0°F | Resp 12 | Ht 63.0 in | Wt 111.2 lb

## 2015-03-08 DIAGNOSIS — E118 Type 2 diabetes mellitus with unspecified complications: Secondary | ICD-10-CM

## 2015-03-08 DIAGNOSIS — I10 Essential (primary) hypertension: Secondary | ICD-10-CM

## 2015-03-08 DIAGNOSIS — K5909 Other constipation: Secondary | ICD-10-CM | POA: Diagnosis not present

## 2015-03-08 DIAGNOSIS — F0150 Vascular dementia without behavioral disturbance: Secondary | ICD-10-CM

## 2015-03-08 MED ORDER — GLIPIZIDE 5 MG PO TABS: ORAL_TABLET | ORAL | Status: DC

## 2015-03-08 NOTE — Progress Notes (Signed)
Subjective:  Patient ID: Kaitlin Edwards, female    DOB: 1921-01-18  Age: 82 y.o. MRN: [id number]  CC: The primary encounter diagnosis was Controlled type 2 diabetes mellitus with complication, without long-term current use of insulin ([place]). Diagnoses of Vascular dementia, uncomplicated, Essential hypertension, and Other constipation were also pertinent to this visit.  HPI Kaitlin Edwards presents for follow up on vascular dementia,   Type 2 DM and hypertension  Memory loss and confusion worsening..  She hasn't been able to find her new insurance card.  Sons live close by in [place].  Sons have been trying to find her new insurance cards for her.  She states that seh has been signed up for [facility] again ,an ddidn't want that.   Feels good, "im still constipated." taking colace prn , and moving bowels.   Dm Type:2:  She has been  taking glipizide once a day before breafast, none at dinner  .  Not checking her sugars anymore.  Appetite is good,  Has gained 2 lbs .  No neuropathy   NO Falls   Outpatient Prescriptions Prior to Visit  Medication Sig Dispense Refill  . brinzolamide (AZOPT) 1 % ophthalmic suspension 1 drop 2 (two) times daily.      . cyanocobalamin (,VITAMIN B-12,) 1000 MCG/ML injection Inject 1 mL (1,000 mcg total) into the muscle once. I ml IM injection monthly 10 mL 2  . docusate sodium (COLACE) 100 MG capsule Take 100 mg by mouth daily.     . fish oil-omega-3 fatty acids 1000 MG capsule Take 1,000 mg by mouth as needed.     Marland Kitchen glucose blood (ACCU-CHEK AVIVA) test strip Check blood sugar twice daily Dx 250.00 50 each 5  . losartan (COZAAR) 100 MG tablet TAKE 1 TABLET EVERY DAY 90 tablet 2  . Multiple Vitamins-Minerals (ICAPS PO) Take by mouth 2 (two) times daily.     . Nebivolol HCl 20 MG TABS Take 1 tablet (20 mg total) by mouth daily. 90 tablet 0  . glipiZIDE (GLUCOTROL) 5 MG tablet TAKE 1 TABLET EVERY MORNING  AND TAKE 1/2 TABLET EVERY EVENING 135 tablet 1  .  ACCU-CHEK SOFTCLIX LANCETS lancets 1 each by Other route daily. Use as instructed 100 each 3  . traMADol (ULTRAM) 50 MG tablet TAKE 1 TABLET EVERY 6 HOURS AS NEEDED (Patient not taking: Reported on 03/08/2015) 90 tablet 3  . traMADol (ULTRAM) 50 MG tablet TAKE 1 TABLET EVERY 6 HOURS AS NEEDED (Patient not taking: Reported on 03/08/2015) 30 tablet 4  . Vitamin D, Ergocalciferol, (DRISDOL) 50000 UNITS CAPS capsule TAKE 1 CAPSULE ONE TIME WEEKLY (Patient not taking: Reported on 03/08/2015) 12 capsule 1   No facility-administered medications prior to visit.    Review of Systems;  Patient denies headache, fevers, malaise, unintentional weight loss, skin rash, eye pain, sinus congestion and sinus pain, sore throat, dysphagia,  hemoptysis , cough, dyspnea, wheezing, chest pain, palpitations, orthopnea, edema, abdominal pain, nausea, melena, diarrhea, constipation, flank pain, dysuria, hematuria, urinary  Frequency, nocturia, numbness, tingling, seizures,  Focal weakness, Loss of consciousness,  Tremor, insomnia, depression, anxiety, and suicidal ideation.      Objective:  BP 148/72 mmHg  Pulse 78  Temp(Src) 98 F (36.7 C) (Oral)  Resp 12  Ht 5\' 3"  (1.6 m)  Wt 111 lb 4 oz (50.463 kg)  BMI 19.71 kg/m2  SpO2 97%  BP Readings from Last 3 Encounters:  03/08/15 148/72  02/16/15 138/72  01/20/15 128/74  Wt Readings from Last 3 Encounters:  03/08/15 111 lb 4 oz (50.463 kg)  02/16/15 111 lb 1.9 oz (50.404 kg)  01/20/15 109 lb 6.4 oz (49.624 kg)    General appearance: alert, cooperative and appears stated age Ears: normal TM's and external ear canals both ears Throat: lips, mucosa, and tongue normal; teeth and gums normal Neck: no adenopathy, no carotid bruit, supple, symmetrical, trachea midline and thyroid not enlarged, symmetric, no tenderness/mass/nodules Back: symmetric, no curvature. ROM normal. No CVA tenderness. Lungs: clear to auscultation bilaterally Heart: regular rate and rhythm,  S1, S2 normal, no murmur, click, rub or gallop Abdomen: soft, non-tender; bowel sounds normal; no masses,  no organomegaly Pulses: 2+ and symmetric Skin: Skin color, texture, turgor normal. No rashes or lesions Lymph nodes: Cervical, supraclavicular, and axillary nodes normal.  Lab Results  Component Value Date   HGBA1C 6.9* 01/20/2015   HGBA1C 7.1* 08/14/2014   HGBA1C 7.0* 03/12/2014    Lab Results  Component Value Date   CREATININE 0.94 01/20/2015   CREATININE 0.83 08/14/2014   CREATININE 0.7 03/12/2014    Lab Results  Component Value Date   WBC 9.1 08/29/2013   HGB 12.6 08/29/2013   HCT 40.3 07/10/2011   PLT 301 08/29/2013   GLUCOSE 153* 01/20/2015   CHOL 205* 08/14/2014   TRIG 67 08/14/2014   HDL 91 08/14/2014   LDLDIRECT 106* 08/14/2014   LDLCALC 101* 08/14/2014   ALT 25 08/14/2014   AST 19 08/14/2014   NA 135 01/20/2015   K 4.4 01/20/2015   CL 100 01/20/2015   CREATININE 0.94 01/20/2015   BUN 23 01/20/2015   CO2 28 01/20/2015   TSH 1.56 12/04/2013   HGBA1C 6.9* 01/20/2015   MICROALBUR 1.3 12/17/2012    Dg Chest 2 View  09/16/2014  CLINICAL DATA:  Cough and weight loss for 3 months.  Hypertension. EXAM: CHEST  2 VIEW COMPARISON:  07/10/2011 FINDINGS: Heart size is at the upper limits of normal and stable. No evidence of congestive heart failure. Changes of COPD are again demonstrated. Right upper lobe and lingular scarring remains stable in appearance. No evidence of acute infiltrate or pleural effusion. No definite mass or lymphadenopathy identified. IMPRESSION: Stable COPD and bilateral upper lobe scarring.  No active disease. Electronically Signed   By: Earle Gell M.D.   On: 09/16/2014 13:45    Assessment & Plan:   Problem List Items Addressed This Visit    Type 2 diabetes mellitus, controlled ([place]) - Primary    Well controlled on once daily glipizide    Lab Results  Component Value Date   HGBA1C 6.9* 01/20/2015   Lab Results  Component Value Date     MICROALBUR 1.3 12/17/2012         Relevant Medications   glipiZIDE (GLUCOTROL) 5 MG tablet   Hypertension    Well controlled for her age on current regimen. Renal function stable, no changes today.  Lab Results  Component Value Date   CREATININE 0.94 01/20/2015   Lab Results  Component Value Date   NA 135 01/20/2015   K 4.4 01/20/2015   CL 100 01/20/2015   CO2 28 01/20/2015         Relevant Medications   amLODipine (NORVASC) 5 MG tablet   Vascular dementia, uncomplicated    She continues to live independently with both sons involved,  [name] , the RN manages her medications      Relevant Medications   amLODipine (NORVASC) 5 MG tablet  Constipation    Managed with prn colace. She denies rectal bleeding and abdominal discomfort.         I have discontinued Ms. Rodwell's glipiZIDE. I have also changed her glipiZIDE. Additionally, I am having her maintain her fish oil-omega-3 fatty acids, brinzolamide, Multiple Vitamins-Minerals (ICAPS PO), ACCU-CHEK SOFTCLIX LANCETS, glucose blood, cyanocobalamin, docusate sodium, losartan, Nebivolol HCl, Vitamin D (Ergocalciferol), traMADol, traMADol, and amLODipine.  Meds ordered this encounter  Medications  . amLODipine (NORVASC) 5 MG tablet    Sig: Take 5 mg by mouth daily.  Marland Kitchen DISCONTD: glipiZIDE (GLUCOTROL) 5 MG tablet    Sig: TAKE 1 TABLET EVERY MORNING  AND TAKE 1/2 TABLET EVERY EVENING    Dispense:  45 tablet    Refill:  0  . glipiZIDE (GLUCOTROL) 5 MG tablet    Sig: TAKE 1 TABLET EVERY MORNING  BEFORE BREAKFAST    Dispense:  30 tablet    Refill:  0    PLEASE REPLACE PREVIOUSLY SENT RX FOR TWICE DAILY WITH THIS ONE    Medications Discontinued During This Encounter  Medication Reason  . glipiZIDE (GLUCOTROL) 5 MG tablet Reorder  . glipiZIDE (GLUCOTROL) 5 MG tablet Reorder  . glipiZIDE (GLUCOTROL) 5 MG tablet Reorder    Follow-up: No Follow-up on file.   Crecencio Mc, MD

## 2015-03-08 NOTE — Patient Instructions (Signed)
You are doing very well.  I have refilled your glipizide locally at [facility]   No changes today  See you in 3 months

## 2015-03-08 NOTE — Progress Notes (Signed)
Pre-visit discussion using our clinic review tool. No additional management support is needed unless otherwise documented below in the visit note.  

## 2015-03-09 ENCOUNTER — Telehealth: Payer: Self-pay | Admitting: Internal Medicine

## 2015-03-09 NOTE — Telephone Encounter (Signed)
Called [name] the RN for [facility] and was advised she will send copies of her insurance card and will make sure patient has an account set up with the new mail order.

## 2015-03-09 NOTE — Assessment & Plan Note (Signed)
Well controlled for her age on current regimen. Renal function stable, no changes today.  Lab Results  Component Value Date   CREATININE 0.94 01/20/2015   Lab Results  Component Value Date   NA 135 01/20/2015   K 4.4 01/20/2015   CL 100 01/20/2015   CO2 28 01/20/2015

## 2015-03-09 NOTE — Assessment & Plan Note (Addendum)
Managed with prn colace. She denies rectal bleeding and abdominal discomfort.

## 2015-03-09 NOTE — Assessment & Plan Note (Signed)
Well controlled on once daily glipizide    Lab Results  Component Value Date   HGBA1C 6.9* 01/20/2015   Lab Results  Component Value Date   MICROALBUR 1.3 12/17/2012

## 2015-03-09 NOTE — Assessment & Plan Note (Signed)
She continues to live independently with both sons involved,  [name] , the RN manages her medications

## 2015-03-18 ENCOUNTER — Other Ambulatory Visit: Payer: Self-pay | Admitting: *Deleted

## 2015-03-18 MED ORDER — LOSARTAN POTASSIUM 100 MG PO TABS: 100.0000 mg | ORAL_TABLET | Freq: Every day | ORAL | Status: DC

## 2015-03-18 MED ORDER — AMLODIPINE BESYLATE 5 MG PO TABS: 5.0000 mg | ORAL_TABLET | Freq: Every day | ORAL | Status: DC

## 2015-03-18 MED ORDER — NEBIVOLOL HCL 20 MG PO TABS: 20.0000 mg | ORAL_TABLET | Freq: Every day | ORAL | Status: DC

## 2015-03-18 MED ORDER — GLIPIZIDE 5 MG PO TABS: ORAL_TABLET | ORAL | Status: DC

## 2015-03-23 DIAGNOSIS — H353221 Exudative age-related macular degeneration, left eye, with active choroidal neovascularization: Secondary | ICD-10-CM | POA: Diagnosis not present

## 2015-03-29 ENCOUNTER — Other Ambulatory Visit: Payer: Self-pay | Admitting: Internal Medicine

## 2015-03-29 NOTE — Telephone Encounter (Signed)
[name] the nurse at [facility] [place] called [phone]) to inform the physician that the patient's tramadol needs to be sent to [place] [phone].

## 2015-03-30 NOTE — Telephone Encounter (Signed)
Please advise if refill can be done, new pharmacy. Thanks

## 2015-03-30 NOTE — Telephone Encounter (Signed)
tramdol printed for mail order sending

## 2015-03-30 NOTE — Telephone Encounter (Signed)
OK to fill

## 2015-04-27 DIAGNOSIS — H353190 Nonexudative age-related macular degeneration, unspecified eye, stage unspecified: Secondary | ICD-10-CM | POA: Diagnosis not present

## 2015-05-11 DIAGNOSIS — H353124 Nonexudative age-related macular degeneration, left eye, advanced atrophic with subfoveal involvement: Secondary | ICD-10-CM | POA: Diagnosis not present

## 2015-05-28 ENCOUNTER — Other Ambulatory Visit: Payer: Self-pay | Admitting: Internal Medicine

## 2015-06-08 ENCOUNTER — Ambulatory Visit (INDEPENDENT_AMBULATORY_CARE_PROVIDER_SITE_OTHER): Payer: PPO | Admitting: Cardiovascular Disease

## 2015-06-08 ENCOUNTER — Encounter: Payer: Self-pay | Admitting: Cardiovascular Disease

## 2015-06-08 ENCOUNTER — Ambulatory Visit: Payer: Self-pay | Admitting: Internal Medicine

## 2015-06-08 VITALS — BP 162/78 | HR 77 | Ht 63.0 in | Wt 110.2 lb

## 2015-06-08 DIAGNOSIS — I447 Left bundle-branch block, unspecified: Secondary | ICD-10-CM

## 2015-06-08 DIAGNOSIS — I5022 Chronic systolic (congestive) heart failure: Secondary | ICD-10-CM

## 2015-06-08 NOTE — Progress Notes (Signed)
Cardiology Office Note   Date:  06/08/2015   ID:  Kaitlin Edwards, DOB 11-01-1920, MRN [id number]  PCP:  Crecencio Mc, MD  Cardiologist:   Kathlyn Sacramento, MD   Chief Complaint  Patient presents with  . other    6 month f/u no complaints today. Pt did not bring medlist today and seems to be confused about medications she is taking.  Meds reviewed verbally with pt.      History of Present Illness: Kaitlin Edwards is a 82 y.o. female who presents for a followup visit regarding cardiomyopathy and LBBB . She was admitted to [facility] in May of 2013 for accelerated hypertension with epistaxis. She had an echocardiogram done which showed an ejection fraction of 40% with septal motion abnormalities suggestive of conduction disease as well as septal and distal anterior hypokinesis with evidence of moderate pulmonary hypertension.  Her vision has deteriorated due to macular degeneration and she does not drive. She continues to live at the [facility] and seems to be more confused about her medications than before. She doesn't know when she is taking exactly and according to her, somebody at the [facility]. with her medications. She denies any chest pain or shortness of breath.   Past Medical History  Diagnosis Date  . Diabetes mellitus     diet controlled  . Glaucoma   . Hypertension     Past Surgical History  Procedure Laterality Date  . Cataract extraction       Current Outpatient Prescriptions  Medication Sig Dispense Refill  . ACCU-CHEK SOFTCLIX LANCETS lancets 1 each by Other route daily. Use as instructed 100 each 3  . brinzolamide (AZOPT) 1 % ophthalmic suspension 1 drop 2 (two) times daily.      Marland Kitchen BYSTOLIC 20 MG TABS Take 1 tablet by mouth daily 90 tablet 1  . cyanocobalamin (,VITAMIN B-12,) 1000 MCG/ML injection Inject 1 mL (1,000 mcg total) into the muscle once. I ml IM injection monthly 10 mL 2  . docusate sodium (COLACE) 100 MG capsule Take 100 mg by mouth  daily.     . fish oil-omega-3 fatty acids 1000 MG capsule Take 1,000 mg by mouth as needed.     Marland Kitchen glipiZIDE (GLUCOTROL) 5 MG tablet Take 1 tablet by mouth every morning before breakfast 90 tablet 1  . glucose blood (ACCU-CHEK AVIVA) test strip Check blood sugar twice daily Dx 250.00 50 each 5  . losartan (COZAAR) 100 MG tablet Take 1 tablet by mouth daily 90 tablet 1  . Multiple Vitamins-Minerals (ICAPS PO) Take by mouth 2 (two) times daily.     . traMADol (ULTRAM) 50 MG tablet TAKE 1 TABLET EVERY 6 HOURS AS NEEDED 90 tablet 3  . traMADol (ULTRAM) 50 MG tablet TAKE 1 TABLET EVERY 6 HOURS AS NEEDED 30 tablet 4  . Vitamin D, Ergocalciferol, (DRISDOL) 50000 UNITS CAPS capsule TAKE 1 CAPSULE ONE TIME WEEKLY 12 capsule 1   No current facility-administered medications for this visit.    Allergies:   Review of patient's allergies indicates no known allergies.    Social History:  The patient  reports that she has never smoked. She has never used smokeless tobacco. She reports that she drinks alcohol. She reports that she does not use illicit drugs.   Family History:  The patient's family history includes Hypertension in her brother; Parkinsonism in her mother.    ROS:  Please see the history of present illness.   Otherwise,  review of systems are positive for none.   All other systems are reviewed and negative.    PHYSICAL EXAM: VS:  BP 162/78 mmHg  Pulse 77  Ht 5\' 3"  (1.6 m)  Wt 110 lb 4 oz (50.009 kg)  BMI 19.53 kg/m2 , BMI Body mass index is 19.53 kg/(m^2). GEN: Well nourished, well developed, in no acute distress HEENT: normal Neck: no JVD, carotid bruits, or masses Cardiac: RRR; no murmurs, rubs, or gallops,no edema  Respiratory:  clear to auscultation bilaterally, normal work of breathing GI: soft, nontender, nondistended, + BS MS: no deformity or atrophy Skin: warm and dry, no rash Neuro:  Strength and sensation are intact Psych: euthymic mood, full affect   EKG:  EKG is  ordered today. The ekg ordered today demonstrates sinus rhythm with first-degree AV block and left bundle branch block   Recent Labs: 08/14/2014: ALT 25 01/20/2015: BUN 23; Creatinine, Ser 0.94; Potassium 4.4; Sodium 135    Lipid Panel    Component Value Date/Time   CHOL 205* 08/14/2014 1615   CHOL 217* 07/11/2011 0246   TRIG 67 08/14/2014 1615   TRIG 59 07/11/2011 0246   HDL 91 08/14/2014 1615   HDL 83* 07/11/2011 0246   CHOLHDL 2.3 08/14/2014 1615   VLDL 13 08/14/2014 1615   VLDL 12 07/11/2011 0246   LDLCALC 101* 08/14/2014 1615   LDLCALC 122* 07/11/2011 0246   LDLDIRECT 106* 08/14/2014 1615      Wt Readings from Last 3 Encounters:  06/08/15 110 lb 4 oz (50.009 kg)  03/08/15 111 lb 4 oz (50.463 kg)  02/16/15 111 lb 1.9 oz (50.404 kg)         ASSESSMENT AND PLAN:  1.  Chronic systolic heart failure: No evidence of volume overload. Continue medical therapy. The patient appears to be more confused about her medications and her dementia might be getting worse. It might be easier just to minimize her office visits and given that she has not had active cardiac symptoms, I will have her follow-up with me as needed only.  2. Essential hypertension: Blood pressure is elevated but she does not know her medications. She has a follow-up appointment with Dr.Tullo Tomorrow. I gave her written instructions to take her medications with her to that office visit. She used to be on amlodipine but I don't see that on her list.    Disposition:   FU with me as needed.   Signed,  Kathlyn Sacramento, MD  06/08/2015 2:24 PM    [place]

## 2015-06-08 NOTE — Patient Instructions (Signed)
Medication Instructions: No change  Labwork: None.   Procedures/Testing: None.   Follow-Up: As needed with Dr. Fletcher Anon  Any Additional Special Instructions Will Be Listed Below (If Applicable).  Please take your medications or a list of your medications to your visit with Dr. Derrel Nip tomorrow.    If you need a refill on your cardiac medications before your next appointment, please call your pharmacy.

## 2015-06-09 ENCOUNTER — Ambulatory Visit (INDEPENDENT_AMBULATORY_CARE_PROVIDER_SITE_OTHER): Payer: PPO | Admitting: Internal Medicine

## 2015-06-09 ENCOUNTER — Encounter: Payer: Self-pay | Admitting: Internal Medicine

## 2015-06-09 ENCOUNTER — Telehealth: Payer: Self-pay | Admitting: *Deleted

## 2015-06-09 VITALS — BP 124/68 | HR 79 | Temp 98.0°F | Resp 12 | Ht 63.0 in | Wt 110.8 lb

## 2015-06-09 DIAGNOSIS — E785 Hyperlipidemia, unspecified: Secondary | ICD-10-CM

## 2015-06-09 DIAGNOSIS — E1122 Type 2 diabetes mellitus with diabetic chronic kidney disease: Secondary | ICD-10-CM

## 2015-06-09 DIAGNOSIS — K5900 Constipation, unspecified: Secondary | ICD-10-CM | POA: Diagnosis not present

## 2015-06-09 DIAGNOSIS — E1169 Type 2 diabetes mellitus with other specified complication: Secondary | ICD-10-CM | POA: Diagnosis not present

## 2015-06-09 DIAGNOSIS — I10 Essential (primary) hypertension: Secondary | ICD-10-CM | POA: Diagnosis not present

## 2015-06-09 DIAGNOSIS — G4700 Insomnia, unspecified: Secondary | ICD-10-CM

## 2015-06-09 LAB — COMPREHENSIVE METABOLIC PANEL
ALT: 26 U/L (ref 0–35)
AST: 20 U/L (ref 0–37)
Albumin: 4.1 g/dL (ref 3.5–5.2)
Alkaline Phosphatase: 72 U/L (ref 39–117)
BUN: 25 mg/dL — ABNORMAL HIGH (ref 6–23)
CALCIUM: 9.5 mg/dL (ref 8.4–10.5)
CHLORIDE: 99 meq/L (ref 96–112)
CO2: 30 meq/L (ref 19–32)
Creatinine, Ser: 0.77 mg/dL (ref 0.40–1.20)
GFR: 74.46 mL/min (ref >60.00)
Glucose, Bld: 157 mg/dL — ABNORMAL HIGH (ref 70–99)
Potassium: 3.9 mEq/L (ref 3.5–5.1)
Sodium: 134 mEq/L — ABNORMAL LOW (ref 135–145)
Total Bilirubin: 0.8 mg/dL (ref 0.2–1.2)
Total Protein: 7.3 g/dL (ref 6.0–8.3)

## 2015-06-09 LAB — MICROALBUMIN / CREATININE URINE RATIO
Creatinine,U: 115.9 mg/dL
Microalb Creat Ratio: 5.4 mg/g (ref 0.0–30.0)
Microalb, Ur: 6.2 mg/dL — ABNORMAL HIGH (ref 0.0–1.9)

## 2015-06-09 LAB — LIPID PANEL
CHOLESTEROL: 201 mg/dL — AB (ref 0–200)
HDL: 81.9 mg/dL (ref >39.00)
LDL CALC: 103 mg/dL — AB (ref 0–99)
NonHDL: 119.08
TRIGLYCERIDES: 81 mg/dL (ref 0.0–149.0)
Total CHOL/HDL Ratio: 2
VLDL: 16.2 mg/dL (ref 0.0–40.0)

## 2015-06-09 LAB — LDL CHOLESTEROL, DIRECT: Direct LDL: 97 mg/dL

## 2015-06-09 LAB — HEMOGLOBIN A1C: Hgb A1c MFr Bld: 7.3 % — ABNORMAL HIGH (ref 4.6–6.5)

## 2015-06-09 MED ORDER — MELATONIN 5 MG PO TABS: ORAL_TABLET | ORAL | Status: DC

## 2015-06-09 MED ORDER — POLYETHYLENE GLYCOL 3350 17 GM/SCOOP PO POWD: 17.0000 g | Freq: Every day | ORAL | Status: DC

## 2015-06-09 MED ORDER — DOCUSATE SODIUM 100 MG PO CAPS: 200.0000 mg | ORAL_CAPSULE | Freq: Every day | ORAL | Status: DC

## 2015-06-09 NOTE — Telephone Encounter (Signed)
Called o find out if [facility]ill was called [facility] to [facility] for patient tramadol recently, called pharmacy and they stated refill to soon, trying to find out whether mediation is in process of being delivered.

## 2015-06-09 NOTE — Patient Instructions (Addendum)
  For your insomnia:  I  would like to treat your insomnia safely using a natural hormone.  Let's try melatonin 5 mg every night at 9 pm.     For your constipation :  You should take Miralax every day  After dinner with your dinner water.   I also recommend a Stool softener at bedtime with a sip of water.   For your vision changes:  Ask your sons Mikki Santee or Delfino Lovett  to get you a table top magnifying glass to read with .   Your blood pressure is fine today  You are taking Bystolic and Losartan daily for blood pressure

## 2015-06-09 NOTE — Telephone Encounter (Signed)
Spoke with [employer] [facility] [place] & she states that they did not call in a refill request. She feels that the patient did (she gets confused at times). She does not not a refill at this time.

## 2015-06-09 NOTE — Progress Notes (Signed)
Subjective:  Patient ID: Kaitlin Edwards, female    DOB: February 25, 1920  Age: 82 y.o. MRN: [id number]  CC: The primary encounter diagnosis was Constipation, unspecified constipation type. Diagnoses of Essential hypertension, Controlled type 2 diabetes mellitus with chronic kidney disease, without long-term current use of insulin, unspecified CKD stage ([place]), Hyperlipidemia associated with type 2 diabetes mellitus ([place]), and Insomnia were also pertinent to this visit.  HPI RYLIN OHLMANN presents for follow up on hypertension and type 2 DM  Cc: not sleeping well, feeling fatigued. Routine reviewed.  She goes to bed by 10-11.  Gets up  At least twice .  Sleeps in until 7:30 am.  Wakes up tired. No history of snoring.    Constipation .  "I take a ton of laxatives" She resorts to laxataive if you has more than one day without stooling.   Vision is failing.  Sees eye doctor for macular degeneration  And glaucoma. . Still able to read, and has had no falls because of vision.    Does not check blood sugars anymore.  No lows.  She is not sure of her medications.  Her administration is managed by the RN at [facility].     Lab Results  Component Value Date   HGBA1C 7.3* 06/09/2015     Outpatient Prescriptions Prior to Visit  Medication Sig Dispense Refill  . ACCU-CHEK SOFTCLIX LANCETS lancets 1 each by Other route daily. Use as instructed 100 each 3  . brinzolamide (AZOPT) 1 % ophthalmic suspension 1 drop 2 (two) times daily.      Marland Kitchen BYSTOLIC 20 MG TABS Take 1 tablet by mouth daily 90 tablet 1  . cyanocobalamin (,VITAMIN B-12,) 1000 MCG/ML injection Inject 1 mL (1,000 mcg total) into the muscle once. I ml IM injection monthly 10 mL 2  . docusate sodium (COLACE) 100 MG capsule Take 100 mg by mouth daily.     . fish oil-omega-3 fatty acids 1000 MG capsule Take 1,000 mg by mouth as needed.     Marland Kitchen glipiZIDE (GLUCOTROL) 5 MG tablet Take 1 tablet by mouth every morning before breakfast 90 tablet 1  .  glucose blood (ACCU-CHEK AVIVA) test strip Check blood sugar twice daily Dx 250.00 50 each 5  . losartan (COZAAR) 100 MG tablet Take 1 tablet by mouth daily 90 tablet 1  . Multiple Vitamins-Minerals (ICAPS PO) Take by mouth 2 (two) times daily.     . traMADol (ULTRAM) 50 MG tablet TAKE 1 TABLET EVERY 6 HOURS AS NEEDED 30 tablet 4  . Vitamin D, Ergocalciferol, (DRISDOL) 50000 UNITS CAPS capsule TAKE 1 CAPSULE ONE TIME WEEKLY 12 capsule 1  . traMADol (ULTRAM) 50 MG tablet TAKE 1 TABLET EVERY 6 HOURS AS NEEDED 90 tablet 3   No facility-administered medications prior to visit.    Review of Systems;  Patient denies headache, fevers, malaise, unintentional weight loss, skin rash, eye pain, sinus congestion and sinus pain, sore throat, dysphagia,  hemoptysis , cough, dyspnea, wheezing, chest pain, palpitations, orthopnea, edema, abdominal pain, nausea, melena, diarrhea, constipation, flank pain, dysuria, hematuria, urinary  Frequency, nocturia, numbness, tingling, seizures,  Focal weakness, Loss of consciousness,  Tremor, insomnia, depression, anxiety, and suicidal ideation.      Objective:  BP 124/68 mmHg  Pulse 79  Temp(Src) 98 F (36.7 C) (Oral)  Resp 12  Ht 5\' 3"  (1.6 m)  Wt 110 lb 12 oz (50.236 kg)  BMI 19.62 kg/m2  SpO2 97%  BP Readings from Last 3  Encounters:  06/09/15 124/68  06/08/15 162/78  03/08/15 148/72    Wt Readings from Last 3 Encounters:  06/09/15 110 lb 12 oz (50.236 kg)  06/08/15 110 lb 4 oz (50.009 kg)  03/08/15 111 lb 4 oz (50.463 kg)    General appearance: alert, cooperative and appears stated age Ears: normal TM's and external ear canals both ears Throat: lips, mucosa, and tongue normal; teeth and gums normal Neck: no adenopathy, no carotid bruit, supple, symmetrical, trachea midline and thyroid not enlarged, symmetric, no tenderness/mass/nodules Back: symmetric, no curvature. ROM normal. No CVA tenderness. Lungs: clear to auscultation bilaterally Heart:  regular rate and rhythm, S1, S2 normal, no murmur, click, rub or gallop Abdomen: soft, non-tender; bowel sounds normal; no masses,  no organomegaly Pulses: 2+ and symmetric Skin: Skin color, texture, turgor normal. No rashes or lesions Lymph nodes: Cervical, supraclavicular, and axillary nodes normal.  Lab Results  Component Value Date   HGBA1C 7.3* 06/09/2015   HGBA1C 6.9* 01/20/2015   HGBA1C 7.1* 08/14/2014    Lab Results  Component Value Date   CREATININE 0.77 06/09/2015   CREATININE 0.94 01/20/2015   CREATININE 0.83 08/14/2014    Lab Results  Component Value Date   WBC 9.1 08/29/2013   HGB 12.6 08/29/2013   HCT 40.3 07/10/2011   PLT 301 08/29/2013   GLUCOSE 157* 06/09/2015   CHOL 201* 06/09/2015   TRIG 81.0 06/09/2015   HDL 81.90 06/09/2015   LDLDIRECT 97.0 06/09/2015   LDLCALC 103* 06/09/2015   ALT 26 06/09/2015   AST 20 06/09/2015   NA 134* 06/09/2015   K 3.9 06/09/2015   CL 99 06/09/2015   CREATININE 0.77 06/09/2015   BUN 25* 06/09/2015   CO2 30 06/09/2015   TSH 1.56 12/04/2013   HGBA1C 7.3* 06/09/2015   MICROALBUR 6.2* 06/09/2015    Dg Chest 2 View  09/16/2014  CLINICAL DATA:  Cough and weight loss for 3 months.  Hypertension. EXAM: CHEST  2 VIEW COMPARISON:  07/10/2011 FINDINGS: Heart size is at the upper limits of normal and stable. No evidence of congestive heart failure. Changes of COPD are again demonstrated. Right upper lobe and lingular scarring remains stable in appearance. No evidence of acute infiltrate or pleural effusion. No definite mass or lymphadenopathy identified. IMPRESSION: Stable COPD and bilateral upper lobe scarring.  No active disease. Electronically Signed   By: Earle Gell M.D.   On: 09/16/2014 13:45    Assessment & Plan:   Problem List Items Addressed This Visit    Type 2 diabetes mellitus, controlled ([place])    Well controlled for age on once daily glipizide    Lab Results  Component Value Date   HGBA1C 7.3* 06/09/2015   Lab  Results  Component Value Date   MICROALBUR 6.2* 06/09/2015           Relevant Orders   Hemoglobin A1c (Completed)   Lipid panel (Completed)   Microalbumin / creatinine urine ratio (Completed)   Hypertension    Well controlled for her age on current regimen. Renal function stable, no changes today.  Lab Results  Component Value Date   CREATININE 0.77 06/09/2015   Lab Results  Component Value Date   NA 134* 06/09/2015   K 3.9 06/09/2015   CL 99 06/09/2015   CO2 30 06/09/2015           Constipation - Primary    Recommended daily use of miralax and colace.       Relevant Orders  Comprehensive metabolic panel (Completed)   Insomnia    Trial of melatonin advised.       Hyperlipidemia associated with type 2 diabetes mellitus ([place])   Relevant Orders   LDL cholesterol, direct (Completed)      I am having Ms. Repass start on Melatonin, docusate sodium, and polyethylene glycol powder. I am also having her maintain her fish oil-omega-3 fatty acids, brinzolamide, Multiple Vitamins-Minerals (ICAPS PO), ACCU-CHEK SOFTCLIX LANCETS, glucose blood, cyanocobalamin, docusate sodium, Vitamin D (Ergocalciferol), traMADol, traMADol, BYSTOLIC, glipiZIDE, and losartan.  Meds ordered this encounter  Medications  . Melatonin 5 MG TABS    Sig: 1 tablet daily at 9 pm for insomnia    Dispense:  90 tablet    Refill:  0  . docusate sodium (COLACE) 100 MG capsule    Sig: Take 2 capsules (200 mg total) by mouth daily. At bedtime    Dispense:  60 capsule    Refill:  2  . polyethylene glycol powder (GLYCOLAX/MIRALAX) powder    Sig: Take 17 g by mouth daily. After dinner with a full glass of water    Dispense:  3350 g    Refill:  1    There are no discontinued medications.  Follow-up: Return in about 3 months (around 09/08/2015) for follow up diabetes.   Crecencio Mc, MD

## 2015-06-09 NOTE — Progress Notes (Signed)
Pre-visit discussion using our clinic review tool. No additional management support is needed unless otherwise documented below in the visit note.  

## 2015-06-11 DIAGNOSIS — G4700 Insomnia, unspecified: Secondary | ICD-10-CM | POA: Insufficient documentation

## 2015-06-11 NOTE — Assessment & Plan Note (Signed)
Well controlled for age on once daily glipizide    Lab Results  Component Value Date   HGBA1C 7.3* 06/09/2015   Lab Results  Component Value Date   MICROALBUR 6.2* 06/09/2015

## 2015-06-11 NOTE — Assessment & Plan Note (Signed)
Recommended daily use of miralax and colace.

## 2015-06-11 NOTE — Assessment & Plan Note (Signed)
Trial of melatonin advised.

## 2015-06-11 NOTE — Assessment & Plan Note (Signed)
Well controlled for her age on current regimen. Renal function stable, no changes today.  Lab Results  Component Value Date   CREATININE 0.77 06/09/2015   Lab Results  Component Value Date   NA 134* 06/09/2015   K 3.9 06/09/2015   CL 99 06/09/2015   CO2 30 06/09/2015

## 2015-08-05 ENCOUNTER — Telehealth: Payer: Self-pay | Admitting: Internal Medicine

## 2015-08-11 ENCOUNTER — Telehealth: Payer: Self-pay | Admitting: Internal Medicine

## 2015-08-11 NOTE — Telephone Encounter (Signed)
Please schedule patient an appointment 30 minute OV for attending physicians statement.

## 2015-08-12 NOTE — Telephone Encounter (Signed)
Called and made an appointment for pt to come in on July 10, at 3:30.

## 2015-08-23 ENCOUNTER — Encounter: Payer: Self-pay | Admitting: Internal Medicine

## 2015-08-23 ENCOUNTER — Ambulatory Visit (INDEPENDENT_AMBULATORY_CARE_PROVIDER_SITE_OTHER): Payer: PPO | Admitting: Internal Medicine

## 2015-08-23 VITALS — BP 158/78 | HR 72 | Temp 98.2°F | Resp 12 | Ht 63.0 in | Wt 106.5 lb

## 2015-08-23 DIAGNOSIS — E559 Vitamin D deficiency, unspecified: Secondary | ICD-10-CM | POA: Diagnosis not present

## 2015-08-23 DIAGNOSIS — R5382 Chronic fatigue, unspecified: Secondary | ICD-10-CM | POA: Diagnosis not present

## 2015-08-23 DIAGNOSIS — F0150 Vascular dementia without behavioral disturbance: Secondary | ICD-10-CM | POA: Diagnosis not present

## 2015-08-23 DIAGNOSIS — E1121 Type 2 diabetes mellitus with diabetic nephropathy: Secondary | ICD-10-CM

## 2015-08-23 DIAGNOSIS — I10 Essential (primary) hypertension: Secondary | ICD-10-CM

## 2015-08-23 NOTE — Progress Notes (Signed)
Pre-visit discussion using our clinic review tool. No additional management support is needed unless otherwise documented below in the visit note.  

## 2015-08-23 NOTE — Progress Notes (Signed)
Subjective:  Patient ID: Kaitlin Edwards, female    DOB: 1921-01-26  Age: 82 y.o. MRN: [id number]  CC: The primary encounter diagnosis was Chronic fatigue. Diagnoses of Vitamin D deficiency, Vascular dementia, uncomplicated, Essential hypertension, and Controlled type 2 diabetes mellitus with diabetic nephropathy, without long-term current use of insulin ([place]) were also pertinent to this visit.  HPI Kaitlin Edwards presents for follow up on chronic issues.  She has  vascular dementia diagnosed in Sept 2013 with mild cognitive impairment which has progressed over time.  She has lost weight steadily and now requires assistance in managing her medications, both due to memory loss and severe visual impairment from macular degeneration. She continues to live independently but has her medications and meals supervised and administered by The clinic at [facility].  She is requesting completion of an affadavit of her condition to support her use of benefits for long term care insurance  type 2 DM:  She doe snot check her sugars anymore because her vision is poort.  She denies symptoms of hypoglycemia.   Hypertension.Patient is taking her medications as prescribed and notes no adverse effects.    She is avoiding added salt in her diet and walking regularly about 3 times per week for exercise  . . .     Lab Results  Component Value Date   HGBA1C 7.3* 06/09/2015     Outpatient Prescriptions Prior to Visit  Medication Sig Dispense Refill  . ACCU-CHEK SOFTCLIX LANCETS lancets 1 each by Other route daily. Use as instructed 100 each 3  . BYSTOLIC 20 MG TABS Take 1 tablet by mouth daily 90 tablet 1  . cyanocobalamin (,VITAMIN B-12,) 1000 MCG/ML injection Inject 1 mL (1,000 mcg total) into the muscle once. I ml IM injection monthly 10 mL 2  . docusate sodium (COLACE) 100 MG capsule Take 100 mg by mouth daily.     Marland Kitchen docusate sodium (COLACE) 100 MG capsule Take 2 capsules (200 mg total) by mouth daily. At  bedtime 60 capsule 2  . glipiZIDE (GLUCOTROL) 5 MG tablet Take 1 tablet by mouth every morning before breakfast 90 tablet 1  . glucose blood (ACCU-CHEK AVIVA) test strip Check blood sugar twice daily Dx 250.00 50 each 5  . losartan (COZAAR) 100 MG tablet Take 1 tablet by mouth daily 90 tablet 1  . Multiple Vitamins-Minerals (ICAPS PO) Take by mouth 2 (two) times daily.     . polyethylene glycol powder (GLYCOLAX/MIRALAX) powder Take 17 g by mouth daily. After dinner with a full glass of water 3350 g 1  . traMADol (ULTRAM) 50 MG tablet TAKE 1 TABLET EVERY 6 HOURS AS NEEDED 90 tablet 3  . traMADol (ULTRAM) 50 MG tablet TAKE 1 TABLET EVERY 6 HOURS AS NEEDED 30 tablet 4  . brinzolamide (AZOPT) 1 % ophthalmic suspension 1 drop 2 (two) times daily.      . fish oil-omega-3 fatty acids 1000 MG capsule Take 1,000 mg by mouth as needed. Reported on 08/23/2015    . Melatonin 5 MG TABS 1 tablet daily at 9 pm for insomnia (Patient not taking: Reported on 08/23/2015) 90 tablet 0  . Vitamin D, Ergocalciferol, (DRISDOL) 50000 UNITS CAPS capsule TAKE 1 CAPSULE ONE TIME WEEKLY (Patient not taking: Reported on 08/23/2015) 12 capsule 1   No facility-administered medications prior to visit.    Review of Systems;  Patient denies headache, fevers, malaise, unintentional weight loss, skin rash, eye pain, sinus congestion and sinus pain, sore throat, dysphagia,  hemoptysis , cough, dyspnea, wheezing, chest pain, palpitations, orthopnea, edema, abdominal pain, nausea, melena, diarrhea, constipation, flank pain, dysuria, hematuria, urinary  Frequency, nocturia, numbness, tingling, seizures,  Focal weakness, Loss of consciousness,  Tremor, insomnia, depression, anxiety, and suicidal ideation.      Objective:  BP 158/78 mmHg  Pulse 72  Temp(Src) 98.2 F (36.8 C) (Oral)  Resp 12  Ht 5\' 3"  (1.6 m)  Wt 106 lb 8 oz (48.308 kg)  BMI 18.87 kg/m2  SpO2 97%  BP Readings from Last 3 Encounters:  08/23/15 158/78  06/09/15  124/68  06/08/15 162/78    Wt Readings from Last 3 Encounters:  08/23/15 106 lb 8 oz (48.308 kg)  06/09/15 110 lb 12 oz (50.236 kg)  06/08/15 110 lb 4 oz (50.009 kg)    General appearance: alert, cooperative and appears stated age Ears: normal TM's and external ear canals both ears Throat: lips, mucosa, and tongue normal; teeth and gums normal Neck: no adenopathy, no carotid bruit, supple, symmetrical, trachea midline and thyroid not enlarged, symmetric, no tenderness/mass/nodules Back: symmetric, no curvature. ROM normal. No CVA tenderness. Lungs: clear to auscultation bilaterally Heart: regular rate and rhythm, S1, S2 normal, no murmur, click, rub or gallop Abdomen: soft, non-tender; bowel sounds normal; no masses,  no organomegaly Pulses: 2+ and symmetric Skin: Skin color, texture, turgor normal. No rashes or lesions Lymph nodes: Cervical, supraclavicular, and axillary nodes normal.  Lab Results  Component Value Date   HGBA1C 7.3* 06/09/2015   HGBA1C 6.9* 01/20/2015   HGBA1C 7.1* 08/14/2014    Lab Results  Component Value Date   CREATININE 0.79 08/23/2015   CREATININE 0.77 06/09/2015   CREATININE 0.94 01/20/2015    Lab Results  Component Value Date   WBC 13.1* 08/23/2015   HGB 12.7 08/23/2015   HCT 38.2 08/23/2015   PLT 300.0 08/23/2015   GLUCOSE 152* 08/23/2015   CHOL 201* 06/09/2015   TRIG 81.0 06/09/2015   HDL 81.90 06/09/2015   LDLDIRECT 97.0 06/09/2015   LDLCALC 103* 06/09/2015   ALT 21 08/23/2015   AST 20 08/23/2015   NA 134* 08/23/2015   K 4.4 08/23/2015   CL 99 08/23/2015   CREATININE 0.79 08/23/2015   BUN 27* 08/23/2015   CO2 28 08/23/2015   TSH 1.27 08/23/2015   HGBA1C 7.3* 06/09/2015   MICROALBUR 6.2* 06/09/2015    Dg Chest 2 View  09/16/2014  CLINICAL DATA:  Cough and weight loss for 3 months.  Hypertension. EXAM: CHEST  2 VIEW COMPARISON:  07/10/2011 FINDINGS: Heart size is at the upper limits of normal and stable. No evidence of congestive  heart failure. Changes of COPD are again demonstrated. Right upper lobe and lingular scarring remains stable in appearance. No evidence of acute infiltrate or pleural effusion. No definite mass or lymphadenopathy identified. IMPRESSION: Stable COPD and bilateral upper lobe scarring.  No active disease. Electronically Signed   By: Earle Gell M.D.   On: 09/16/2014 13:45    Assessment & Plan:   Problem List Items Addressed This Visit    Type 2 diabetes mellitus, controlled ([place])    Well controlled for age on once daily glipizide    Lab Results  Component Value Date   HGBA1C 7.3* 06/09/2015   Lab Results  Component Value Date   MICROALBUR 6.2* 06/09/2015             Hypertension    Well controlled on current regimen. Renal function stable, no changes today.  Lab Results  Component Value Date   CREATININE 0.79 08/23/2015   Lab Results  Component Value Date   NA 134* 08/23/2015   K 4.4 08/23/2015   CL 99 08/23/2015   CO2 28 08/23/2015         Vascular dementia, uncomplicated    MMSE repeated today: score is 22/30, clock face OK, animals 14  Words 7 MRI  Brain  2012 showed subcortical and deep white matter ischemic changes which were unchanged compared to 2010 MRI.  She continues to live independently with both sons involved,  [name] , the RN for [employer],  manages her medications   The 2 page affadavit was completed for patient and will be mailed to the company.        Fatigue - Primary   Relevant Orders   Comprehensive metabolic panel (Completed)   B12 (Completed)   CBC with Differential/Platelet (Completed)   TSH (Completed)    Other Visit Diagnoses    Vitamin D deficiency        Relevant Orders    VITAMIN D 25 Hydroxy (Vit-D Deficiency, Fractures) (Completed)       I am having Ms. Gailey maintain her fish oil-omega-3 fatty acids, brinzolamide, Multiple Vitamins-Minerals (ICAPS PO), ACCU-CHEK SOFTCLIX LANCETS, glucose blood, cyanocobalamin, docusate  sodium, Vitamin D (Ergocalciferol), traMADol, traMADol, BYSTOLIC, glipiZIDE, losartan, Melatonin, docusate sodium, and polyethylene glycol powder.  No orders of the defined types were placed in this encounter.    There are no discontinued medications.  Follow-up: No Follow-up on file.   Crecencio Mc, MD

## 2015-08-23 NOTE — Patient Instructions (Signed)
I will complete your Long term care form and send of tomorrow to the company  We are checking b12 level and thyroid today

## 2015-08-24 LAB — COMPREHENSIVE METABOLIC PANEL
ALBUMIN: 4 g/dL (ref 3.5–5.2)
ALT: 21 U/L (ref 0–35)
AST: 20 U/L (ref 0–37)
Alkaline Phosphatase: 83 U/L (ref 39–117)
BUN: 27 mg/dL — ABNORMAL HIGH (ref 6–23)
CALCIUM: 9.8 mg/dL (ref 8.4–10.5)
CO2: 28 meq/L (ref 19–32)
CREATININE: 0.79 mg/dL (ref 0.40–1.20)
Chloride: 99 mEq/L (ref 96–112)
GFR: 72.26 mL/min (ref >60.00)
Glucose, Bld: 152 mg/dL — ABNORMAL HIGH (ref 70–99)
POTASSIUM: 4.4 meq/L (ref 3.5–5.1)
Sodium: 134 mEq/L — ABNORMAL LOW (ref 135–145)
Total Bilirubin: 0.8 mg/dL (ref 0.2–1.2)
Total Protein: 7.3 g/dL (ref 6.0–8.3)

## 2015-08-24 LAB — CBC WITH DIFFERENTIAL/PLATELET
BASOS PCT: 0.3 % (ref 0.0–3.0)
Basophils Absolute: 0 10*3/uL (ref 0.0–0.1)
EOS PCT: 0.6 % (ref 0.0–5.0)
Eosinophils Absolute: 0.1 10*3/uL (ref 0.0–0.7)
HEMATOCRIT: 38.2 % (ref 36.0–46.0)
HEMOGLOBIN: 12.7 g/dL (ref 12.0–15.0)
LYMPHS PCT: 10.7 % — AB (ref 12.0–46.0)
Lymphs Abs: 1.4 10*3/uL (ref 0.7–4.0)
MCHC: 33.3 g/dL (ref 30.0–36.0)
MCV: 87.4 fl (ref 78.0–100.0)
MONO ABS: 0.9 10*3/uL (ref 0.1–1.0)
MONOS PCT: 7.2 % (ref 3.0–12.0)
Neutro Abs: 10.6 10*3/uL — ABNORMAL HIGH (ref 1.4–7.7)
Neutrophils Relative %: 81.2 % — ABNORMAL HIGH (ref 43.0–77.0)
Platelets: 300 10*3/uL (ref 150.0–400.0)
RBC: 4.37 Mil/uL (ref 3.87–5.11)
RDW: 13.8 % (ref 11.5–15.5)
WBC: 13.1 10*3/uL — AB (ref 4.0–10.5)

## 2015-08-24 LAB — VITAMIN B12: VITAMIN B 12: 578 pg/mL (ref 211–911)

## 2015-08-24 LAB — VITAMIN D 25 HYDROXY (VIT D DEFICIENCY, FRACTURES): VITD: 46.04 ng/mL (ref 30.00–100.00)

## 2015-08-24 LAB — TSH: TSH: 1.27 u[IU]/mL (ref 0.35–4.50)

## 2015-08-24 NOTE — Assessment & Plan Note (Signed)
Well controlled on current regimen. Renal function stable, no changes today.  Lab Results  Component Value Date   CREATININE 0.79 08/23/2015   Lab Results  Component Value Date   NA 134* 08/23/2015   K 4.4 08/23/2015   CL 99 08/23/2015   CO2 28 08/23/2015

## 2015-08-24 NOTE — Assessment & Plan Note (Signed)
Well controlled for age on once daily glipizide    Lab Results  Component Value Date   HGBA1C 7.3* 06/09/2015   Lab Results  Component Value Date   MICROALBUR 6.2* 06/09/2015

## 2015-08-24 NOTE — Assessment & Plan Note (Addendum)
MMSE repeated today: score is 22/30, clock face OK, animals 14  Words 7 MRI  Brain  2012 showed subcortical and deep white matter ischemic changes which were unchanged compared to 2010 MRI.  She continues to live independently with both sons involved,  [name] , the RN for [employer],  manages her medications   The 2 page affadavit was completed for patient and will be mailed to the company.

## 2015-08-27 NOTE — Telephone Encounter (Signed)
No action needed

## 2015-09-22 ENCOUNTER — Other Ambulatory Visit: Payer: Self-pay | Admitting: Internal Medicine

## 2015-09-23 NOTE — Telephone Encounter (Signed)
Rx called in to pharmacy. 

## 2015-10-20 DIAGNOSIS — H401134 Primary open-angle glaucoma, bilateral, indeterminate stage: Secondary | ICD-10-CM | POA: Diagnosis not present

## 2015-10-20 LAB — HM DIABETES EYE EXAM

## 2015-10-21 ENCOUNTER — Encounter: Payer: Self-pay | Admitting: Internal Medicine

## 2015-12-14 DIAGNOSIS — H401134 Primary open-angle glaucoma, bilateral, indeterminate stage: Secondary | ICD-10-CM | POA: Diagnosis not present

## 2015-12-24 ENCOUNTER — Encounter: Payer: Self-pay | Admitting: Family Medicine

## 2015-12-24 ENCOUNTER — Ambulatory Visit (INDEPENDENT_AMBULATORY_CARE_PROVIDER_SITE_OTHER): Payer: PPO

## 2015-12-24 ENCOUNTER — Ambulatory Visit (INDEPENDENT_AMBULATORY_CARE_PROVIDER_SITE_OTHER): Payer: PPO | Admitting: Family Medicine

## 2015-12-24 VITALS — BP 156/92 | HR 91 | Temp 97.7°F | Resp 12 | Wt 109.4 lb

## 2015-12-24 DIAGNOSIS — M545 Low back pain, unspecified: Secondary | ICD-10-CM

## 2015-12-24 DIAGNOSIS — M546 Pain in thoracic spine: Secondary | ICD-10-CM | POA: Insufficient documentation

## 2015-12-24 DIAGNOSIS — M5136 Other intervertebral disc degeneration, lumbar region: Secondary | ICD-10-CM | POA: Diagnosis not present

## 2015-12-24 MED ORDER — TRAMADOL HCL 50 MG PO TABS: 50.0000 mg | ORAL_TABLET | Freq: Four times a day (QID) | ORAL | 0 refills | Status: AC | PRN

## 2015-12-24 MED ORDER — BACLOFEN 10 MG PO TABS: 10.0000 mg | ORAL_TABLET | Freq: Three times a day (TID) | ORAL | 0 refills | Status: DC | PRN

## 2015-12-24 NOTE — Assessment & Plan Note (Signed)
New problem. Suspect musculoskeletal etiology. Given known osteoporosis and advanced age, obtaining x-ray to ensure no underlying fracture. Treating with baclofen and tramadol.

## 2015-12-24 NOTE — Progress Notes (Signed)
Subjective:  Patient ID: Kaitlin Edwards, female    DOB: [birth year]  Age: 82 y.o. MRN: [id number]  CC: Back pain  HPI:  82 year old female with vascular dementia, HTN, HLD, DM-2, Osteoporosis, CHF presents with complaints of back pain.  Back pain  Of note, patient is a poor historian secondary to vascular dementia. She's not able to give much history.  Patient has had left sided back pain for the past 2-3 weeks.  Located in the lower thoracic and upper lumbar region.  No reports of fall or injury.  She describes it as an annoyance.  No reports of radicular symptoms.  I asked her if she's taken her tramadol for the pain and she states unsure when the last time she took it and unsure of it has helped her pain.  No known exacerbating factors.  She does state that is intermittent.  No other complaints at this time.  Social Hx   Social History   Social History  . Marital status: Married    Spouse name: N/A  . Number of children: N/A  . Years of education: N/A   Social History Main Topics  . Smoking status: Never Smoker  . Smokeless tobacco: Never Used  . Alcohol use Yes     Comment: occasional-wine  . Drug use: No  . Sexual activity: Not Currently   Other Topics Concern  . None   Social History Narrative  . None   Review of Systems  Constitutional: Negative.   Genitourinary: Negative.   Musculoskeletal: Positive for back pain.    Objective:  BP (!) 156/92 (BP Location: Right Arm, Patient Position: Sitting, Cuff Size: Normal)   Pulse 91   Temp 97.7 F (36.5 C) (Oral)   Resp 12   Wt 109 lb 6 oz (49.6 kg)   SpO2 96%   BMI 19.37 kg/m   BP/Weight 12/24/2015 08/23/2015 [id number]  Systolic BP A999333 0000000 A999333  Diastolic BP 92 78 68  Wt. (Lbs) 109.38 106.5 110.75  BMI 19.37 18.87 19.62   Physical Exam  Constitutional:  Frail elderly female in no acute distress.  Cardiovascular: Normal rate and regular rhythm.   2/6 systolic murmur.  Pulmonary/Chest:  Effort normal and breath sounds normal.  Musculoskeletal:  Thoracic kyphosis noted. Thoracic spine and lumbar spine nontender to palpation.  Neurological: She is alert.  Psychiatric: She has a normal mood and affect.  Vitals reviewed.  Lab Results  Component Value Date   WBC 13.1 (H) 08/23/2015   HGB 12.7 08/23/2015   HCT 38.2 08/23/2015   PLT 300.0 08/23/2015   GLUCOSE 152 (H) 08/23/2015   CHOL 201 (H) 06/09/2015   TRIG 81.0 06/09/2015   HDL 81.90 06/09/2015   LDLDIRECT 97.0 06/09/2015   LDLCALC 103 (H) 06/09/2015   ALT 21 08/23/2015   AST 20 08/23/2015   NA 134 (L) 08/23/2015   K 4.4 08/23/2015   CL 99 08/23/2015   CREATININE 0.79 08/23/2015   BUN 27 (H) 08/23/2015   CO2 28 08/23/2015   TSH 1.27 08/23/2015   HGBA1C 7.3 (H) 06/09/2015   MICROALBUR 6.2 (H) 06/09/2015    Assessment & Plan:   Problem List Items Addressed This Visit    Acute left-sided thoracic back pain - Primary    New problem. Suspect musculoskeletal etiology. Given known osteoporosis and advanced age, obtaining x-ray to ensure no underlying fracture. Treating with baclofen and tramadol.      Relevant Medications   baclofen (LIORESAL) 10 MG tablet  traMADol (ULTRAM) 50 MG tablet   Other Relevant Orders   DG Thoracic Spine 2 View   Acute left-sided low back pain without sciatica    New problem. Suspect musculoskeletal etiology. Given known osteoporosis and advanced age, obtaining x-ray to ensure no underlying fracture. Treating with baclofen and tramadol.      Relevant Medications   baclofen (LIORESAL) 10 MG tablet   traMADol (ULTRAM) 50 MG tablet   Other Relevant Orders   DG Lumbar Spine 2-3 Views      Meds ordered this encounter  Medications  . baclofen (LIORESAL) 10 MG tablet    Sig: Take 1 tablet (10 mg total) by mouth 3 (three) times daily as needed for muscle spasms.    Dispense:  30 each    Refill:  0  . traMADol (ULTRAM) 50 MG tablet    Sig: Take 1 tablet (50 mg total) by  mouth every 6 (six) hours as needed.    Dispense:  90 tablet    Refill:  0        Follow-up: PRN  [place]

## 2015-12-24 NOTE — Progress Notes (Signed)
Pre visit review using our clinic review tool, if applicable. No additional management support is needed unless otherwise documented below in the visit note. 

## 2015-12-24 NOTE — Patient Instructions (Signed)
Take the baclofen and tramadol for your pain.  We will call with the xray results.  Take care  Dr. Lacinda Axon

## 2015-12-29 ENCOUNTER — Telehealth: Payer: Self-pay | Admitting: *Deleted

## 2015-12-29 NOTE — Telephone Encounter (Signed)
Son received tramadol via mail and sent to mother. I have called in muscle relaxer to [facility] which was requested by son richard who is on DPR.

## 2015-12-29 NOTE — Telephone Encounter (Addendum)
Patients son stated that [facility] did not receive the Rx for baclofen and tramadol Please call Richard(son) when this order is sent to the pharmacy [phone]  Update: Son reported that the tramadol was received through mail order.  For the future, son would like to use [facility] pharmacy for all Rx.

## 2016-01-10 DIAGNOSIS — H3530 Unspecified macular degeneration: Secondary | ICD-10-CM | POA: Diagnosis not present

## 2016-01-10 DIAGNOSIS — G3184 Mild cognitive impairment, so stated: Secondary | ICD-10-CM | POA: Diagnosis not present

## 2016-01-10 DIAGNOSIS — R41841 Cognitive communication deficit: Secondary | ICD-10-CM | POA: Diagnosis not present

## 2016-01-14 DIAGNOSIS — H3530 Unspecified macular degeneration: Secondary | ICD-10-CM | POA: Diagnosis not present

## 2016-01-14 DIAGNOSIS — R41841 Cognitive communication deficit: Secondary | ICD-10-CM | POA: Diagnosis not present

## 2016-01-14 DIAGNOSIS — G3184 Mild cognitive impairment, so stated: Secondary | ICD-10-CM | POA: Diagnosis not present

## 2016-01-30 DIAGNOSIS — R410 Disorientation, unspecified: Secondary | ICD-10-CM | POA: Diagnosis not present

## 2016-01-30 DIAGNOSIS — N390 Urinary tract infection, site not specified: Secondary | ICD-10-CM | POA: Diagnosis not present

## 2016-01-31 ENCOUNTER — Telehealth: Payer: Self-pay | Admitting: Internal Medicine

## 2016-01-31 ENCOUNTER — Encounter
Admission: RE | Admit: 2016-01-31 | Discharge: 2016-01-31 | Disposition: A | Discharge status: Home / Self Care | Payer: PPO | Source: Ambulatory Visit | Attending: Internal Medicine | Admitting: Internal Medicine

## 2016-01-31 DIAGNOSIS — R4182 Altered mental status, unspecified: Secondary | ICD-10-CM | POA: Insufficient documentation

## 2016-01-31 NOTE — Telephone Encounter (Signed)
Kaitlin Edwards dropped off a FL2 and a order for pt. Paper is up front in color folder.

## 2016-02-01 DIAGNOSIS — E119 Type 2 diabetes mellitus without complications: Secondary | ICD-10-CM | POA: Diagnosis not present

## 2016-02-01 DIAGNOSIS — I10 Essential (primary) hypertension: Secondary | ICD-10-CM | POA: Diagnosis not present

## 2016-02-01 DIAGNOSIS — M810 Age-related osteoporosis without current pathological fracture: Secondary | ICD-10-CM | POA: Diagnosis not present

## 2016-02-01 DIAGNOSIS — F0390 Unspecified dementia without behavioral disturbance: Secondary | ICD-10-CM | POA: Diagnosis not present

## 2016-02-01 NOTE — Telephone Encounter (Signed)
I have placed FL2 and order for assisted living in red folder

## 2016-02-01 NOTE — Telephone Encounter (Signed)
[name] from [facility] called back returning your call. Thank you!  Call [phone]

## 2016-02-01 NOTE — Telephone Encounter (Signed)
If patient was not endorsing symptoms of a UTI,  And the UA shows no leukocytes, then she may not need the augmentin,  they may have attributed her confusion to UTI that is not really there. Did they run a culture?

## 2016-02-01 NOTE — Telephone Encounter (Signed)
[name] from [facility] has requested a call in reference to [facility] paperwork  Contact [phone]

## 2016-02-01 NOTE — Telephone Encounter (Signed)
Talked with [name] she stated that on Saturday patient was very confused and was taken to Urgent care and treated for UTI  With Augmentin 500/125 BID for  Ten days, Patient urine showed Protein 30 , trace ketones but no leukocytes present. Patient has been placed at skill nursing due to confusion and patient not safe a lone  Nurse is asking for FL2 and order for skill placed in red folder.

## 2016-02-01 NOTE — Telephone Encounter (Signed)
Left message For Kaitlin Edwards to return call.

## 2016-02-01 NOTE — Telephone Encounter (Signed)
Left message for [name] RN to call Office.

## 2016-02-02 ENCOUNTER — Telehealth: Payer: Self-pay | Admitting: *Deleted

## 2016-02-02 NOTE — Telephone Encounter (Signed)
I have placed in quick sign.

## 2016-02-02 NOTE — Telephone Encounter (Signed)
Notified RN at [facility] to stop the ABX may be contributing to confusion. Also was notified [facility] are ready for pick up.

## 2016-02-02 NOTE — Telephone Encounter (Signed)
[name] from the [place] has requested orders for pt to stop her antibiotic  Fax [phone] Orinda Kenner

## 2016-02-02 NOTE — Telephone Encounter (Signed)
Left message for patient to return call to office. 

## 2016-02-03 ENCOUNTER — Telehealth: Payer: Self-pay | Admitting: *Deleted

## 2016-02-03 NOTE — Telephone Encounter (Signed)
Please call Richard at [phone] today

## 2016-02-03 NOTE — Telephone Encounter (Signed)
Left message to please return my call.

## 2016-02-03 NOTE — Telephone Encounter (Signed)
Patients son requested a call to discuss pt's care and medications, son stated that pt is currently residing in a different area [place]. Patient has had some medication changes as well per son  Contact Richard [phone]

## 2016-02-03 NOTE — Telephone Encounter (Signed)
called patient son and advised why the ABX was stopped by Dr. Derrel Nip ,.ask what the plan of care would be directed him to call Gara Kroner RN for the [facility] and she could direct him to Dr. Ouida Sills because patient will be under his care at the [facility].

## 2016-02-03 NOTE — Telephone Encounter (Signed)
Orders written signed and faxed to Gara Kroner RN

## 2016-02-10 ENCOUNTER — Non-Acute Institutional Stay (SKILLED_NURSING_FACILITY): Payer: PPO | Admitting: Gerontology

## 2016-02-10 DIAGNOSIS — R4182 Altered mental status, unspecified: Secondary | ICD-10-CM | POA: Diagnosis not present

## 2016-02-10 DIAGNOSIS — R404 Transient alteration of awareness: Secondary | ICD-10-CM

## 2016-02-10 DIAGNOSIS — N390 Urinary tract infection, site not specified: Secondary | ICD-10-CM

## 2016-02-10 LAB — FOLATE: Folate: 36 ng/mL (ref >5.9)

## 2016-02-10 LAB — URINALYSIS, COMPLETE (UACMP) WITH MICROSCOPIC
BACTERIA UA: NONE SEEN
BILIRUBIN URINE: NEGATIVE
GLUCOSE, UA: NEGATIVE mg/dL
HGB URINE DIPSTICK: NEGATIVE
Ketones, ur: 20 mg/dL — AB
LEUKOCYTES UA: NEGATIVE
NITRITE: NEGATIVE
PH: 5 (ref 5.0–8.0)
Protein, ur: NEGATIVE mg/dL
SPECIFIC GRAVITY, URINE: 1.021 (ref 1.005–1.030)
Squamous Epithelial / LPF: NONE SEEN

## 2016-02-10 LAB — CBC WITH DIFFERENTIAL/PLATELET
BASOS PCT: 0 %
Basophils Absolute: 0 10*3/uL (ref 0–0.1)
EOS ABS: 0 10*3/uL (ref 0–0.7)
Eosinophils Relative: 0 %
HCT: 39.5 % (ref 35.0–47.0)
HEMOGLOBIN: 13.4 g/dL (ref 12.0–16.0)
LYMPHS ABS: 1.2 10*3/uL (ref 1.0–3.6)
Lymphocytes Relative: 10 %
MCH: 29.3 pg (ref 26.0–34.0)
MCHC: 33.8 g/dL (ref 32.0–36.0)
MCV: 86.8 fL (ref 80.0–100.0)
MONO ABS: 0.7 10*3/uL (ref 0.2–0.9)
MONOS PCT: 6 %
NEUTROS PCT: 84 %
Neutro Abs: 10 10*3/uL — ABNORMAL HIGH (ref 1.4–6.5)
Platelets: 286 10*3/uL (ref 150–440)
RBC: 4.56 MIL/uL (ref 3.80–5.20)
RDW: 13.6 % (ref 11.5–14.5)
WBC: 11.9 10*3/uL — ABNORMAL HIGH (ref 3.6–11.0)

## 2016-02-10 LAB — COMPREHENSIVE METABOLIC PANEL
ALK PHOS: 72 U/L (ref 38–126)
ALT: 16 U/L (ref 14–54)
ANION GAP: 11 (ref 5–15)
AST: 17 U/L (ref 15–41)
Albumin: 4.1 g/dL (ref 3.5–5.0)
BILIRUBIN TOTAL: 1.9 mg/dL — AB (ref 0.3–1.2)
BUN: 38 mg/dL — ABNORMAL HIGH (ref 6–20)
CALCIUM: 9.4 mg/dL (ref 8.9–10.3)
CO2: 27 mmol/L (ref 22–32)
CREATININE: 0.68 mg/dL (ref 0.44–1.00)
Chloride: 99 mmol/L — ABNORMAL LOW (ref 101–111)
Glucose, Bld: 108 mg/dL — ABNORMAL HIGH (ref 65–99)
Potassium: 3.6 mmol/L (ref 3.5–5.1)
SODIUM: 137 mmol/L (ref 135–145)
TOTAL PROTEIN: 7.5 g/dL (ref 6.5–8.1)

## 2016-02-10 LAB — VITAMIN B12: VITAMIN B 12: 878 pg/mL (ref 180–914)

## 2016-02-10 LAB — TSH: TSH: 0.843 u[IU]/mL (ref 0.350–4.500)

## 2016-02-10 LAB — MAGNESIUM: MAGNESIUM: 2.2 mg/dL (ref 1.7–2.4)

## 2016-02-11 DIAGNOSIS — R4182 Altered mental status, unspecified: Secondary | ICD-10-CM | POA: Diagnosis not present

## 2016-02-11 LAB — GLUCOSE, CAPILLARY: Glucose-Capillary: 91 mg/dL (ref 65–99)

## 2016-02-11 LAB — VITAMIN D 25 HYDROXY (VIT D DEFICIENCY, FRACTURES): Vit D, 25-Hydroxy: 32.3 ng/mL (ref 30.0–100.0)

## 2016-02-12 ENCOUNTER — Encounter: Payer: Self-pay | Admitting: Emergency Medicine

## 2016-02-12 ENCOUNTER — Emergency Department: Payer: PPO

## 2016-02-12 ENCOUNTER — Emergency Department
Admission: EM | Admit: 2016-02-12 | Discharge: 2016-02-12 | Disposition: A | Discharge status: Home / Self Care | Payer: PPO | Attending: Emergency Medicine | Admitting: Emergency Medicine

## 2016-02-12 DIAGNOSIS — F0390 Unspecified dementia without behavioral disturbance: Secondary | ICD-10-CM | POA: Diagnosis not present

## 2016-02-12 DIAGNOSIS — I110 Hypertensive heart disease with heart failure: Secondary | ICD-10-CM | POA: Diagnosis not present

## 2016-02-12 DIAGNOSIS — E119 Type 2 diabetes mellitus without complications: Secondary | ICD-10-CM | POA: Insufficient documentation

## 2016-02-12 DIAGNOSIS — Z79899 Other long term (current) drug therapy: Secondary | ICD-10-CM | POA: Diagnosis not present

## 2016-02-12 DIAGNOSIS — Z7982 Long term (current) use of aspirin: Secondary | ICD-10-CM | POA: Diagnosis not present

## 2016-02-12 DIAGNOSIS — Z7984 Long term (current) use of oral hypoglycemic drugs: Secondary | ICD-10-CM | POA: Diagnosis not present

## 2016-02-12 DIAGNOSIS — I5022 Chronic systolic (congestive) heart failure: Secondary | ICD-10-CM | POA: Diagnosis not present

## 2016-02-12 DIAGNOSIS — E860 Dehydration: Secondary | ICD-10-CM

## 2016-02-12 DIAGNOSIS — R410 Disorientation, unspecified: Secondary | ICD-10-CM | POA: Diagnosis not present

## 2016-02-12 DIAGNOSIS — R4182 Altered mental status, unspecified: Secondary | ICD-10-CM | POA: Diagnosis not present

## 2016-02-12 HISTORY — DX: Vascular dementia, unspecified severity, without behavioral disturbance, psychotic disturbance, mood disturbance, and anxiety: F01.50

## 2016-02-12 LAB — CBC
HCT: 41.1 % (ref 35.0–47.0)
Hemoglobin: 13.8 g/dL (ref 12.0–16.0)
MCH: 29.5 pg (ref 26.0–34.0)
MCHC: 33.7 g/dL (ref 32.0–36.0)
MCV: 87.5 fL (ref 80.0–100.0)
PLATELETS: 281 10*3/uL (ref 150–440)
RBC: 4.69 MIL/uL (ref 3.80–5.20)
RDW: 14 % (ref 11.5–14.5)
WBC: 10.6 10*3/uL (ref 3.6–11.0)

## 2016-02-12 LAB — COMPREHENSIVE METABOLIC PANEL
ALK PHOS: 64 U/L (ref 38–126)
ALT: 14 U/L (ref 14–54)
AST: 24 U/L (ref 15–41)
Albumin: 4 g/dL (ref 3.5–5.0)
Anion gap: 11 (ref 5–15)
BUN: 27 mg/dL — ABNORMAL HIGH (ref 6–20)
CALCIUM: 9.2 mg/dL (ref 8.9–10.3)
CO2: 27 mmol/L (ref 22–32)
CREATININE: 0.74 mg/dL (ref 0.44–1.00)
Chloride: 100 mmol/L — ABNORMAL LOW (ref 101–111)
GFR calc non Af Amer: >60 mL/min (ref >60)
GLUCOSE: 99 mg/dL (ref 65–99)
Potassium: 3.9 mmol/L (ref 3.5–5.1)
SODIUM: 138 mmol/L (ref 135–145)
Total Bilirubin: 1.8 mg/dL — ABNORMAL HIGH (ref 0.3–1.2)
Total Protein: 7.3 g/dL (ref 6.5–8.1)

## 2016-02-12 LAB — URINALYSIS, COMPLETE (UACMP) WITH MICROSCOPIC
Bacteria, UA: NONE SEEN
Bilirubin Urine: NEGATIVE
Glucose, UA: NEGATIVE mg/dL
Hgb urine dipstick: NEGATIVE
Ketones, ur: 80 mg/dL — AB
Leukocytes, UA: NEGATIVE
Nitrite: NEGATIVE
PH: 6 (ref 5.0–8.0)
Protein, ur: 30 mg/dL — AB
SPECIFIC GRAVITY, URINE: 1.02 (ref 1.005–1.030)
Squamous Epithelial / LPF: NONE SEEN

## 2016-02-12 LAB — URINE CULTURE

## 2016-02-12 NOTE — ED Notes (Signed)
Once ems left - pt looked at [place] and stated "i've been playing possum because I didn't want that guy (ems) to know I was faking". States she wanted to come to ed to report [facility] [place]. States she thinks the place is wonderful and states she gets good care but believes management is corrupt and may try to hurt her. Denies si/hi. States she wanted to report them before she dies so she can save the other residents.

## 2016-02-12 NOTE — ED Triage Notes (Signed)
Per [facility]: Pt. From [facility] d/t change in mentation at lunch time. New med started this am-cipro for UTI. VS Stable, CBG 96.   Upon EMS exiting room, pt.A&Ox4, voices concern that facility is unsafe and wanted to come here to report them. States scared of management , not facility employees.

## 2016-02-12 NOTE — ED Notes (Signed)
Pt discharged and son Kaitlin Edwards took her back to [facility]

## 2016-02-12 NOTE — ED Provider Notes (Signed)
[facility] Emergency Department Provider Note  ____________________________________________   I have reviewed the triage vital signs and the nursing notes.   HISTORY  Chief Complaint Altered Mental Status    HPI Kaitlin Edwards is a 82 y.o. female who presents today complaining of "I feel fine". According to family, patient has a history of dementia and sometimes gets paranoid and confused. There has been no acute change in this and she seems to be at her baseline today. Patient states she wanted to come in to talk about the facility she is being transferred to memory care and she does not wish to be transferred to primary care. However she has no medical complaints at this time.      Past Medical History:  Diagnosis Date  . Diabetes mellitus    diet controlled  . Glaucoma   . Hypertension   . Vascular dementia     Patient Active Problem List   Diagnosis Date Noted  . Acute left-sided thoracic back pain 12/24/2015  . Acute left-sided low back pain without sciatica 12/24/2015  . Insomnia 06/11/2015  . Constipation 09/15/2014  . Hyperlipidemia associated with type 2 diabetes mellitus ([place]) 12/18/2012  . Fatigue 08/22/2012  . Nocturia 03/27/2012  . Vascular dementia, uncomplicated [id number]  . Chronic systolic congestive heart failure ([place]) 07/24/2011  . Left bundle branch block 07/23/2011  . Anosmia 02/05/2011  . Screening for breast cancer 11/05/2010  . Osteoporosis, post-menopausal 11/05/2010  . Type 2 diabetes mellitus, controlled ([place])   . Glaucoma   . Hypertension     Past Surgical History:  Procedure Laterality Date  . CATARACT EXTRACTION      Prior to Admission medications   Medication Sig Start Date End Date Taking? Authorizing Provider  ACCU-CHEK SOFTCLIX LANCETS lancets 1 each by Other route daily. Use as instructed 01/05/12  Yes Crecencio Mc, MD  amLODipine (NORVASC) 5 MG tablet Take 5 mg by mouth daily.   Yes Historical  Provider, MD  amoxicillin-clavulanate (AUGMENTIN) 500-125 MG tablet Take 1 tablet by mouth 2 (two) times daily.   Yes Historical Provider, MD  aspirin EC 81 MG tablet Take 81 mg by mouth daily.   Yes Historical Provider, MD  brinzolamide (AZOPT) 1 % ophthalmic suspension 1 drop 2 (two) times daily.     Yes Historical Provider, MD  BYSTOLIC 20 MG TABS Take 1 tablet by mouth daily 05/31/15  Yes Crecencio Mc, MD  ciprofloxacin (CIPRO) 250 MG tablet Take 250 mg by mouth 2 (two) times daily. 02/12/16 02/16/16 Yes Historical Provider, MD  docusate sodium (COLACE) 100 MG capsule Take 2 capsules (200 mg total) by mouth daily. At bedtime 06/09/15  Yes Crecencio Mc, MD  fish oil-omega-3 fatty acids 1000 MG capsule Take 1,000 mg by mouth as needed. Reported on 08/23/2015   Yes Historical Provider, MD  glipiZIDE (GLUCOTROL) 5 MG tablet Take 1 tablet by mouth every morning before breakfast 05/31/15  Yes Crecencio Mc, MD  glucose blood (ACCU-CHEK AVIVA) test strip Check blood sugar twice daily Dx 250.00 06/30/13  Yes Crecencio Mc, MD  losartan (COZAAR) 100 MG tablet Take 1 tablet by mouth daily 05/31/15  Yes Crecencio Mc, MD  Melatonin 5 MG TABS 1 tablet daily at 9 pm for insomnia 06/09/15  Yes Crecencio Mc, MD  Multiple Vitamins-Minerals (ICAPS PO) Take by mouth 2 (two) times daily.    Yes Historical Provider, MD  polyethylene glycol powder (GLYCOLAX/MIRALAX) powder Take 17 g  by mouth daily. After dinner with a full glass of water 06/09/15  Yes Crecencio Mc, MD  traMADol (ULTRAM) 50 MG tablet Take 1 tablet (50 mg total) by mouth every 6 (six) hours as needed. 12/24/15  Yes Coral Spikes, DO  Vitamin D, Ergocalciferol, (DRISDOL) 50000 UNITS CAPS capsule TAKE 1 CAPSULE ONE TIME WEEKLY 12/17/14  Yes Crecencio Mc, MD  baclofen (LIORESAL) 10 MG tablet Take 1 tablet (10 mg total) by mouth 3 (three) times daily as needed for muscle spasms. 12/24/15   Coral Spikes, DO    Allergies Sulfa antibiotics  Family  History  Problem Relation Age of Onset  . Parkinsonism Mother   . Hypertension Brother     Social History Social History  Substance Use Topics  . Smoking status: Never Smoker  . Smokeless tobacco: Never Used  . Alcohol use No     Comment: occasional-wine    Review of Systems Constitutional: No fever/chills Eyes: No visual changes. ENT: No sore throat. No stiff neck no neck pain Cardiovascular: Denies chest pain. Respiratory: Denies shortness of breath. Gastrointestinal:   no vomiting.  No diarrhea.  No constipation. Genitourinary: Negative for dysuria. Musculoskeletal: Negative lower extremity swelling Skin: Negative for rash. Neurological: Negative for severe headaches, focal weakness or numbness. 10-point ROS otherwise negative.  ____________________________________________   PHYSICAL EXAM:  VITAL SIGNS: ED Triage Vitals  Enc Vitals Group     BP 02/12/16 1237 (!) 190/84     Pulse Rate 02/12/16 1237 92     Resp 02/12/16 1237 18     Temp 02/12/16 1237 98.1 F (36.7 C)     Temp Source 02/12/16 1237 Oral     SpO2 02/12/16 1237 98 %     Weight 02/12/16 1240 102 lb 6.4 oz (46.4 kg)     Height 02/12/16 1240 5\' 5"  (1.651 m)     Head Circumference --      Peak Flow --      Pain Score --      Pain Loc --      Pain Edu? --      Excl. in [place]? --     Constitutional: Alert and oriented To name and place unsure of the date pleasantly demented. Well appearing and in no acute distress. Eyes: Conjunctivae are normal. PERRL. EOMI. Head: Atraumatic. Nose: No congestion/rhinnorhea. Mouth/Throat: Mucous membranes are moist.  Oropharynx non-erythematous. Neck: No stridor.   Nontender with no meningismus Cardiovascular: Normal rate, regular rhythm. Grossly normal heart sounds.  Good peripheral circulation. Respiratory: Normal respiratory effort.  No retractions. Lungs CTAB. Abdominal: Soft and nontender. No distention. No guarding no rebound Back:  There is no focal tenderness  or step off.  there is no midline tenderness there are no lesions noted. there is no CVA tenderness Musculoskeletal: No lower extremity tenderness, no upper extremity tenderness. No joint effusions, no DVT signs strong distal pulses no edema Neurologic:  Normal speech and language. No gross focal neurologic deficits are appreciated.  Skin:  Skin is warm, dry and intact. No rash noted. Psychiatric: Mood and affect are normal. Speech and behavior are normal.  ____________________________________________   LABS (all labs ordered are listed, but only abnormal results are displayed)  Labs Reviewed  COMPREHENSIVE METABOLIC PANEL - Abnormal; Notable for the following:       Result Value   Chloride 100 (*)    BUN 27 (*)    Total Bilirubin 1.8 (*)    All other components within normal  limits  URINALYSIS, COMPLETE (UACMP) WITH MICROSCOPIC - Abnormal; Notable for the following:    Color, Urine YELLOW (*)    APPearance CLEAR (*)    Ketones, ur 80 (*)    Protein, ur 30 (*)    All other components within normal limits  CBC  CBG MONITORING, ED   ____________________________________________  EKG  I personally interpreted any EKGs ordered by me or triage  ____________________________________________  RADIOLOGY  I reviewed any imaging ordered by me or triage that were performed during my shift and, if possible, patient and/or family made aware of any abnormal findings. ____________________________________________   PROCEDURES  Procedure(s) performed: None  Procedures  Critical Care performed: None  ____________________________________________   INITIAL IMPRESSION / ASSESSMENT AND PLAN / ED COURSE  Pertinent labs & imaging results that were available during my care of the patient were reviewed by me and considered in my medical decision making (see chart for details).  Patient here with no acute complaints, she has family at baseline, they don't feel any further intervention is  required. Given her inability to get a good history from her I did do a CT scan of blood workup which was reassuring, blood pressure was somewhat elevated but patient remained somewhat anxious about being here. Given high preponderance of flu in this area in this hospital this time, patient and family are very eager to be sent home and we did do so.  Clinical Course    ____________________________________________   FINAL CLINICAL IMPRESSION(S) / ED DIAGNOSES  Final diagnoses:  Dementia without behavioral disturbance, unspecified dementia type  Dehydration      This chart was dictated using voice recognition software.  Despite best efforts to proofread,  errors can occur which can change meaning.      Schuyler Amor, MD 02/12/16 [phone]

## 2016-02-14 ENCOUNTER — Encounter
Admission: RE | Admit: 2016-02-14 | Discharge: 2016-02-14 | Disposition: A | Discharge status: Home / Self Care | Payer: PPO | Source: Ambulatory Visit | Attending: Internal Medicine | Admitting: Internal Medicine

## 2016-02-14 DIAGNOSIS — E119 Type 2 diabetes mellitus without complications: Secondary | ICD-10-CM | POA: Diagnosis not present

## 2016-02-14 LAB — GLUCOSE, CAPILLARY: GLUCOSE-CAPILLARY: 269 mg/dL — AB (ref 65–99)

## 2016-02-14 NOTE — Progress Notes (Signed)
Location:      Place of Service:  SNF (31) Provider:  Toni Arthurs, NP-C  Crecencio Mc, MD  Patient Care Team: Crecencio Mc, MD as PCP - General (Internal Medicine)  Extended Emergency Contact Information Primary Emergency Contact: Lunt,Richard Address: [address], [place] [id number] Johnnette Litter of [place] Phone: [phone] Relation: Son Secondary Emergency Contact: [place] of [place] Phone: [phone] Relation: Son  Code Status:  full Goals of care: Advanced Directive information Advanced Directives 02/12/2016  Does Patient Have a Medical Advance Directive? No  Type of Advance Directive -  Does patient want to make changes to medical advance directive? -  Copy of [place] in Chart? -     Chief Complaint  Patient presents with  . Acute Visit    HPI:  Pt is a 83 y.o. female seen today for an acute visit for transient alteration of awareness. I was called urgently to the room by nursing as pt was unresponsive. Pt was slumped over in her recliner, not responsive to voice or tactile stimulation. Pt would grimace briefly with noxious stimuli. Pt would not open her eyes for more than 1-2 seconds. Pt unable to follow simple commands. Unable to verbalize. Pt was last seen "normal" several hours prior. Pt was seen by me- talking, alert/ oriented, interactive yesterday evening. Pt without any muscle tone; no reflexes. Pt not observed having dyspnea or apnea. No known fall/ head trauma. Decision was initially made to send pt out to the ED. However, within a few minutes of nursing speaking with son, pt had a visitor come to the room. Pt "woke-up" was alert, oriented, talkative, appropriate. Did seem tired, but not sluggish/lethargic. Nursing called son back with update. Son agreeable to keeping pt in the facility and obtain labs for evaluation. Urine initially did appear to be positive for UTI, cultures/ sensitivities  pending. VSS. No other complaints.    Past Medical History:  Diagnosis Date  . Diabetes mellitus    diet controlled  . Glaucoma   . Hypertension   . Vascular dementia    Past Surgical History:  Procedure Laterality Date  . CATARACT EXTRACTION      Allergies  Allergen Reactions  . Sulfa Antibiotics     Allergies as of 02/10/2016   No Known Allergies     Medication List       Accurate as of 02/10/16 11:59 PM. Always use your most recent med list.          ACCU-CHEK SOFTCLIX LANCETS lancets 1 each by Other route daily. Use as instructed   amoxicillin-clavulanate 500-125 MG tablet Commonly known as:  AUGMENTIN Take 1 tablet by mouth 2 (two) times daily.   baclofen 10 MG tablet Commonly known as:  LIORESAL Take 1 tablet (10 mg total) by mouth 3 (three) times daily as needed for muscle spasms.   brinzolamide 1 % ophthalmic suspension Commonly known as:  AZOPT 1 drop 2 (two) times daily.   BYSTOLIC 20 MG Tabs Generic drug:  Nebivolol HCl Take 1 tablet by mouth daily   docusate sodium 100 MG capsule Commonly known as:  COLACE Take 2 capsules (200 mg total) by mouth daily. At bedtime   fish oil-omega-3 fatty acids 1000 MG capsule Take 1,000 mg by mouth as needed. Reported on 08/23/2015   glipiZIDE 5 MG tablet Commonly known as:  GLUCOTROL Take 1 tablet by mouth every morning before  breakfast   glucose blood test strip Commonly known as:  ACCU-CHEK AVIVA Check blood sugar twice daily Dx 250.00   ICAPS PO Take by mouth 2 (two) times daily.   losartan 100 MG tablet Commonly known as:  COZAAR Take 1 tablet by mouth daily   Melatonin 5 MG Tabs 1 tablet daily at 9 pm for insomnia   polyethylene glycol powder powder Commonly known as:  GLYCOLAX/MIRALAX Take 17 g by mouth daily. After dinner with a full glass of water   traMADol 50 MG tablet Commonly known as:  ULTRAM Take 1 tablet (50 mg total) by mouth every 6 (six) hours as needed.   Vitamin D  (Ergocalciferol) 50000 units Caps capsule Commonly known as:  DRISDOL TAKE 1 CAPSULE ONE TIME WEEKLY       Review of Systems  Unable to perform ROS: Mental status change  Constitutional: Positive for fatigue. Negative for activity change, appetite change, chills, diaphoresis and fever.  HENT: Negative for congestion, sneezing, sore throat, trouble swallowing and voice change.   Eyes: Negative.   Respiratory: Negative for apnea, cough, choking, chest tightness, shortness of breath and wheezing.   Cardiovascular: Negative for chest pain, palpitations and leg swelling.  Gastrointestinal: Negative for abdominal distention, abdominal pain, constipation, diarrhea and nausea.  Endocrine: Negative.   Genitourinary: Negative for difficulty urinating, dysuria, frequency and urgency.  Musculoskeletal: Negative for back pain, gait problem and myalgias. Arthralgias: typical arthritis.  Skin: Negative for color change, pallor, rash and wound.  Allergic/Immunologic: Negative.   Neurological: Negative for dizziness, tremors, seizures, syncope, facial asymmetry, speech difficulty, weakness, numbness and headaches.  Hematological: Negative.   Psychiatric/Behavioral: Negative for agitation and behavioral problems.  All other systems reviewed and are negative.   Immunization History  Administered Date(s) Administered  . Influenza Split 11/30/2011, 12/05/2012, 11/13/2013  . Influenza-Unspecified 11/16/2014  . Pneumococcal Conjugate-13 04/23/2013  . Pneumococcal Polysaccharide-23 10/06/2011  . Tdap 09/15/2014  . Zoster 04/23/2005   Pertinent  Health Maintenance Due  Topic Date Due  . HEMOGLOBIN A1C  12/09/2015  . INFLUENZA VACCINE  12/23/2016 (Originally 09/14/2015)  . FOOT EXAM  03/07/2016  . OPHTHALMOLOGY EXAM  10/19/2016  . DEXA SCAN  Completed  . PNA vac Low Risk Adult  Completed   Fall Risk  02/16/2015 09/17/2014 11/08/2013 12/17/2012  Falls in the past year? No No No No   Functional Status  Survey:    Vitals:   02/06/16 1500  BP: (!) 185/91  Pulse: 83  Resp: 16  Temp: 97.9 F (36.6 C)  SpO2: 97%   There is no height or weight on file to calculate BMI. Physical Exam  Constitutional: She is oriented to person, place, and time. Vital signs are normal. She appears well-developed and well-nourished. She appears lethargic. She is active and cooperative. She does not appear ill. No distress.  HENT:  Head: Normocephalic and atraumatic.  Mouth/Throat: Uvula is midline, oropharynx is clear and moist and mucous membranes are normal. Mucous membranes are not pale, not dry and not cyanotic.  Eyes: Conjunctivae, EOM and lids are normal. Pupils are equal, round, and reactive to light.  Neck: Trachea normal, normal range of motion and full passive range of motion without pain. Neck supple. No JVD present. No tracheal deviation, no edema and no erythema present. No thyromegaly present.  Cardiovascular: Normal rate, regular rhythm, normal heart sounds, intact distal pulses and normal pulses.  Exam reveals no gallop, no distant heart sounds and no friction rub.   No murmur  heard. Pulmonary/Chest: Effort normal. No accessory muscle usage. No respiratory distress. She has decreased breath sounds in the right lower field and the left lower field. She has no wheezes. She has no rhonchi. She has no rales. She exhibits no tenderness.  Abdominal: Soft. Normal appearance and bowel sounds are normal. She exhibits no distension and no ascites. There is no tenderness.  Musculoskeletal: Normal range of motion. She exhibits no edema or tenderness.  Expected osteoarthritis, stiffness  Neurological: She is oriented to person, place, and time. She has normal strength. She appears lethargic. She exhibits abnormal muscle tone.  Skin: Skin is warm, dry and intact. She is not diaphoretic. No cyanosis. No pallor. Nails show no clubbing.  Psychiatric: She has a normal mood and affect. Her speech is normal and  behavior is normal. Judgment and thought content normal. Cognition and memory are normal.  Nursing note and vitals reviewed.   Labs reviewed:  Recent Labs  08/23/15 1617 02/10/16 1400 02/12/16 1245  NA 134* 137 138  K 4.4 3.6 3.9  CL 99 99* 100*  CO2 28 27 27   GLUCOSE 152* 108* 99  BUN 27* 38* 27*  CREATININE 0.79 0.68 0.74  CALCIUM 9.8 9.4 9.2  MG  --  2.2  --     Recent Labs  08/23/15 1617 02/10/16 1400 02/12/16 1245  AST 20 17 24   ALT 21 16 14   ALKPHOS 83 72 64  BILITOT 0.8 1.9* 1.8*  PROT 7.3 7.5 7.3  ALBUMIN 4.0 4.1 4.0    Recent Labs  08/23/15 1617 02/10/16 1600 02/12/16 1245  WBC 13.1* 11.9* 10.6  NEUTROABS 10.6* 10.0*  --   HGB 12.7 13.4 13.8  HCT 38.2 39.5 41.1  MCV 87.4 86.8 87.5  PLT 300.0 286 281   Lab Results  Component Value Date   TSH 0.843 02/10/2016   Lab Results  Component Value Date   HGBA1C 7.3 (H) 06/09/2015   Lab Results  Component Value Date   CHOL 201 (H) 06/09/2015   HDL 81.90 06/09/2015   LDLCALC 103 (H) 06/09/2015   LDLDIRECT 97.0 06/09/2015   TRIG 81.0 06/09/2015   CHOLHDL 2 06/09/2015    Significant Diagnostic Results in last 30 days:  Dg Chest 2 View  Result Date: 02/12/2016 CLINICAL DATA:  Confusion; Per [facility]: Pt. From [facility] d/t change in mentation at lunch time. New med started this am-cipro for UTI. Hx/o HTN. Nonsmoker. EXAM: CHEST  2 VIEW COMPARISON:  09/16/2014 FINDINGS: Cardiac silhouette is mildly enlarged. No mediastinal or hilar masses. No convincing adenopathy. Lungs are hyperexpanded. There are stable areas of lung scarring. No evidence of pneumonia or pulmonary edema. No pleural effusion or pneumothorax. Skeletal structures are demineralized but grossly intact. IMPRESSION: 1. No acute cardiopulmonary disease. 2. Chronic lung changes consistent with COPD and scarring. Electronically Signed   By: Lajean Manes M.D.   On: 02/12/2016 15:06   Ct Head Wo Contrast  Result Date:  02/12/2016 CLINICAL DATA:  Mental status changes and confusion. EXAM: CT HEAD WITHOUT CONTRAST TECHNIQUE: Contiguous axial images were obtained from the base of the skull through the vertex without intravenous contrast. COMPARISON:  MRI of the brain on 02/10/2011 FINDINGS: Brain: The brain demonstrates mild cortical atrophy and mild periventricular small vessel ischemic changes. The brain demonstrates no evidence of hemorrhage, infarction, edema, mass effect, extra-axial fluid collection, hydrocephalus or mass lesion. Vascular: No hyperdense vessel or unexpected calcification. Skull: Normal. Negative for fracture or focal lesion. Sinuses/Orbits: No acute finding.  Other: None. IMPRESSION: No acute findings.  Mild atrophy and small vessel disease. Electronically Signed   By: Aletta Edouard M.D.   On: 02/12/2016 16:07    Assessment/Plan 1. Transient alteration of awareness  (Initially) send to [facility] to eval and treat (then, after pt had increased awareness)  Keep pt in the facility  Labs  Aspiration precautions  Fall precautions  Safety precautions  2. Urinary tract infection without hematuria, site unspecified  Cipro (ciprofloxacin hcl) tablet; 250 mg; amt: 1 tablet; oral Q 12 hours x 5 days  Encourage po fluid intake  Good perineal hygiene  Family/ staff Communication:   Total Time:  Documentation:  Face to Face:  Family/Phone: nursing notified son and updated on condition   Labs/tests ordered:  CBC PROFILE; FOLIC ACID LEVEL; MAGNESIUM; METC; TSH; URINALYSIS (U/A); URINE CULTURE WITH SENSITIVITIES; VIT B12 LEVEL; VIT D LEVEL  Medication list reviewed and assessed for continued appropriateness.  Vikki Ports, NP-C Geriatrics [facility] [phone], [place] [address] Cell Phone (Mon-Fri 8am-5pm):  [phone] On Call:  [phone] & follow prompts after 5pm & weekends Office Phone:  [phone] Office Fax:   [phone]

## 2016-02-15 DIAGNOSIS — E119 Type 2 diabetes mellitus without complications: Secondary | ICD-10-CM | POA: Diagnosis not present

## 2016-02-15 LAB — GLUCOSE, CAPILLARY
GLUCOSE-CAPILLARY: 137 mg/dL — AB (ref 65–99)
GLUCOSE-CAPILLARY: 233 mg/dL — AB (ref 65–99)

## 2016-02-16 ENCOUNTER — Ambulatory Visit: Payer: Self-pay

## 2016-02-16 DIAGNOSIS — E119 Type 2 diabetes mellitus without complications: Secondary | ICD-10-CM | POA: Diagnosis not present

## 2016-02-16 LAB — GLUCOSE, CAPILLARY
Glucose-Capillary: 98 mg/dL (ref 65–99)
Glucose-Capillary: 169 mg/dL — ABNORMAL HIGH (ref 65–99)

## 2016-02-17 ENCOUNTER — Telehealth: Payer: Self-pay | Admitting: Internal Medicine

## 2016-02-17 DIAGNOSIS — E119 Type 2 diabetes mellitus without complications: Secondary | ICD-10-CM | POA: Diagnosis not present

## 2016-02-17 LAB — GLUCOSE, CAPILLARY: Glucose-Capillary: 112 mg/dL — ABNORMAL HIGH (ref 65–99)

## 2016-02-17 NOTE — Telephone Encounter (Signed)
Waiting for Dr. Derrel Nip to fill out

## 2016-02-17 NOTE — Telephone Encounter (Signed)
[name] with [employer] dropped off FL2 forms to be filled out for pt.. Please advise [name] @ [phone] when completed.. Placed in Dr. Demetrios Isaacs colored folder up front

## 2016-02-18 DIAGNOSIS — E119 Type 2 diabetes mellitus without complications: Secondary | ICD-10-CM | POA: Diagnosis not present

## 2016-02-18 LAB — GLUCOSE, CAPILLARY: GLUCOSE-CAPILLARY: 102 mg/dL — AB (ref 65–99)

## 2016-02-19 DIAGNOSIS — E119 Type 2 diabetes mellitus without complications: Secondary | ICD-10-CM | POA: Diagnosis not present

## 2016-02-19 LAB — GLUCOSE, CAPILLARY: Glucose-Capillary: 184 mg/dL — ABNORMAL HIGH (ref 65–99)

## 2016-02-20 DIAGNOSIS — E119 Type 2 diabetes mellitus without complications: Secondary | ICD-10-CM | POA: Diagnosis not present

## 2016-02-20 LAB — GLUCOSE, CAPILLARY: Glucose-Capillary: 143 mg/dL — ABNORMAL HIGH (ref 65–99)

## 2016-02-21 DIAGNOSIS — H3530 Unspecified macular degeneration: Secondary | ICD-10-CM | POA: Diagnosis not present

## 2016-02-21 DIAGNOSIS — F0150 Vascular dementia without behavioral disturbance: Secondary | ICD-10-CM | POA: Diagnosis not present

## 2016-02-21 DIAGNOSIS — M6281 Muscle weakness (generalized): Secondary | ICD-10-CM | POA: Diagnosis not present

## 2016-02-21 DIAGNOSIS — E119 Type 2 diabetes mellitus without complications: Secondary | ICD-10-CM | POA: Diagnosis not present

## 2016-02-21 DIAGNOSIS — N390 Urinary tract infection, site not specified: Secondary | ICD-10-CM | POA: Diagnosis not present

## 2016-02-21 DIAGNOSIS — R4789 Other speech disturbances: Secondary | ICD-10-CM | POA: Diagnosis not present

## 2016-02-21 LAB — GLUCOSE, CAPILLARY
Glucose-Capillary: 89 mg/dL (ref 65–99)
Glucose-Capillary: 238 mg/dL — ABNORMAL HIGH (ref 65–99)

## 2016-02-22 NOTE — Telephone Encounter (Signed)
lmom for Kaitlin Edwards to pick up Fl2 forms from front offic

## 2016-02-23 DIAGNOSIS — E119 Type 2 diabetes mellitus without complications: Secondary | ICD-10-CM | POA: Diagnosis not present

## 2016-02-23 LAB — GLUCOSE, CAPILLARY
Glucose-Capillary: 106 mg/dL — ABNORMAL HIGH (ref 65–99)
Glucose-Capillary: 166 mg/dL — ABNORMAL HIGH (ref 65–99)

## 2016-02-24 DIAGNOSIS — E119 Type 2 diabetes mellitus without complications: Secondary | ICD-10-CM | POA: Diagnosis not present

## 2016-02-24 LAB — GLUCOSE, CAPILLARY
Glucose-Capillary: 97 mg/dL (ref 65–99)
Glucose-Capillary: 219 mg/dL — ABNORMAL HIGH (ref 65–99)

## 2016-02-25 DIAGNOSIS — E119 Type 2 diabetes mellitus without complications: Secondary | ICD-10-CM | POA: Diagnosis not present

## 2016-02-25 LAB — GLUCOSE, CAPILLARY: GLUCOSE-CAPILLARY: 145 mg/dL — AB (ref 65–99)

## 2016-02-26 DIAGNOSIS — E119 Type 2 diabetes mellitus without complications: Secondary | ICD-10-CM | POA: Diagnosis not present

## 2016-02-26 LAB — GLUCOSE, CAPILLARY: Glucose-Capillary: 104 mg/dL — ABNORMAL HIGH (ref 65–99)

## 2016-02-27 DIAGNOSIS — E119 Type 2 diabetes mellitus without complications: Secondary | ICD-10-CM | POA: Diagnosis not present

## 2016-02-27 LAB — GLUCOSE, CAPILLARY: Glucose-Capillary: 257 mg/dL — ABNORMAL HIGH (ref 65–99)

## 2016-02-28 DIAGNOSIS — E119 Type 2 diabetes mellitus without complications: Secondary | ICD-10-CM | POA: Diagnosis not present

## 2016-02-28 LAB — GLUCOSE, CAPILLARY: GLUCOSE-CAPILLARY: 122 mg/dL — AB (ref 65–99)

## 2016-02-29 DIAGNOSIS — E119 Type 2 diabetes mellitus without complications: Secondary | ICD-10-CM | POA: Diagnosis not present

## 2016-02-29 LAB — GLUCOSE, CAPILLARY: Glucose-Capillary: 85 mg/dL (ref 65–99)

## 2016-03-01 DIAGNOSIS — E119 Type 2 diabetes mellitus without complications: Secondary | ICD-10-CM | POA: Diagnosis not present

## 2016-03-01 LAB — GLUCOSE, CAPILLARY
GLUCOSE-CAPILLARY: 112 mg/dL — AB (ref 65–99)
Glucose-Capillary: 170 mg/dL — ABNORMAL HIGH (ref 65–99)

## 2016-03-02 DIAGNOSIS — I10 Essential (primary) hypertension: Secondary | ICD-10-CM | POA: Diagnosis not present

## 2016-03-02 DIAGNOSIS — M810 Age-related osteoporosis without current pathological fracture: Secondary | ICD-10-CM | POA: Diagnosis not present

## 2016-03-02 DIAGNOSIS — F0390 Unspecified dementia without behavioral disturbance: Secondary | ICD-10-CM | POA: Diagnosis not present

## 2016-03-02 DIAGNOSIS — E119 Type 2 diabetes mellitus without complications: Secondary | ICD-10-CM | POA: Diagnosis not present

## 2016-03-16 ENCOUNTER — Encounter
Admission: RE | Admit: 2016-03-16 | Discharge: 2016-03-16 | Disposition: A | Discharge status: Home / Self Care | Payer: PPO | Source: Ambulatory Visit | Attending: Internal Medicine | Admitting: Internal Medicine

## 2016-03-16 DIAGNOSIS — F0150 Vascular dementia without behavioral disturbance: Secondary | ICD-10-CM | POA: Diagnosis not present

## 2016-03-16 DIAGNOSIS — R4789 Other speech disturbances: Secondary | ICD-10-CM | POA: Diagnosis not present

## 2016-03-16 DIAGNOSIS — H3530 Unspecified macular degeneration: Secondary | ICD-10-CM | POA: Diagnosis not present

## 2016-03-16 DIAGNOSIS — E119 Type 2 diabetes mellitus without complications: Secondary | ICD-10-CM | POA: Insufficient documentation

## 2016-03-17 DIAGNOSIS — E119 Type 2 diabetes mellitus without complications: Secondary | ICD-10-CM | POA: Diagnosis not present

## 2016-03-17 LAB — GLUCOSE, CAPILLARY: Glucose-Capillary: 122 mg/dL — ABNORMAL HIGH (ref 65–99)

## 2016-03-18 DIAGNOSIS — E119 Type 2 diabetes mellitus without complications: Secondary | ICD-10-CM | POA: Diagnosis not present

## 2016-03-18 LAB — GLUCOSE, CAPILLARY: Glucose-Capillary: 111 mg/dL — ABNORMAL HIGH (ref 65–99)

## 2016-03-19 DIAGNOSIS — E119 Type 2 diabetes mellitus without complications: Secondary | ICD-10-CM | POA: Diagnosis not present

## 2016-03-19 LAB — GLUCOSE, CAPILLARY: GLUCOSE-CAPILLARY: 130 mg/dL — AB (ref 65–99)

## 2016-03-20 DIAGNOSIS — E119 Type 2 diabetes mellitus without complications: Secondary | ICD-10-CM | POA: Diagnosis not present

## 2016-03-20 LAB — GLUCOSE, CAPILLARY: GLUCOSE-CAPILLARY: 114 mg/dL — AB (ref 65–99)

## 2016-03-21 DIAGNOSIS — E119 Type 2 diabetes mellitus without complications: Secondary | ICD-10-CM | POA: Diagnosis not present

## 2016-03-21 LAB — GLUCOSE, CAPILLARY: GLUCOSE-CAPILLARY: 123 mg/dL — AB (ref 65–99)

## 2016-03-22 DIAGNOSIS — E119 Type 2 diabetes mellitus without complications: Secondary | ICD-10-CM | POA: Diagnosis not present

## 2016-03-22 LAB — GLUCOSE, CAPILLARY
Glucose-Capillary: 128 mg/dL — ABNORMAL HIGH (ref 65–99)
Glucose-Capillary: 119 mg/dL — ABNORMAL HIGH (ref 65–99)

## 2016-03-23 DIAGNOSIS — E119 Type 2 diabetes mellitus without complications: Secondary | ICD-10-CM | POA: Diagnosis not present

## 2016-03-23 LAB — GLUCOSE, CAPILLARY
GLUCOSE-CAPILLARY: 116 mg/dL — AB (ref 65–99)
GLUCOSE-CAPILLARY: 180 mg/dL — AB (ref 65–99)

## 2016-03-24 DIAGNOSIS — E119 Type 2 diabetes mellitus without complications: Secondary | ICD-10-CM | POA: Diagnosis not present

## 2016-03-24 LAB — GLUCOSE, CAPILLARY
GLUCOSE-CAPILLARY: 132 mg/dL — AB (ref 65–99)
Glucose-Capillary: 123 mg/dL — ABNORMAL HIGH (ref 65–99)
Glucose-Capillary: 136 mg/dL — ABNORMAL HIGH (ref 65–99)

## 2016-03-25 DIAGNOSIS — E119 Type 2 diabetes mellitus without complications: Secondary | ICD-10-CM | POA: Diagnosis not present

## 2016-03-25 LAB — GLUCOSE, CAPILLARY: GLUCOSE-CAPILLARY: 126 mg/dL — AB (ref 65–99)

## 2016-03-26 DIAGNOSIS — E119 Type 2 diabetes mellitus without complications: Secondary | ICD-10-CM | POA: Diagnosis not present

## 2016-03-26 LAB — GLUCOSE, CAPILLARY: GLUCOSE-CAPILLARY: 184 mg/dL — AB (ref 65–99)

## 2016-03-27 DIAGNOSIS — E119 Type 2 diabetes mellitus without complications: Secondary | ICD-10-CM | POA: Diagnosis not present

## 2016-03-27 LAB — GLUCOSE, CAPILLARY: GLUCOSE-CAPILLARY: 122 mg/dL — AB (ref 65–99)

## 2016-03-28 DIAGNOSIS — E119 Type 2 diabetes mellitus without complications: Secondary | ICD-10-CM | POA: Diagnosis not present

## 2016-03-28 LAB — GLUCOSE, CAPILLARY
GLUCOSE-CAPILLARY: 110 mg/dL — AB (ref 65–99)
GLUCOSE-CAPILLARY: 131 mg/dL — AB (ref 65–99)

## 2016-03-29 DIAGNOSIS — E119 Type 2 diabetes mellitus without complications: Secondary | ICD-10-CM | POA: Diagnosis not present

## 2016-03-29 LAB — GLUCOSE, CAPILLARY: Glucose-Capillary: 124 mg/dL — ABNORMAL HIGH (ref 65–99)

## 2016-03-30 DIAGNOSIS — E119 Type 2 diabetes mellitus without complications: Secondary | ICD-10-CM | POA: Diagnosis not present

## 2016-03-30 LAB — GLUCOSE, CAPILLARY: Glucose-Capillary: 103 mg/dL — ABNORMAL HIGH (ref 65–99)

## 2016-03-31 DIAGNOSIS — E119 Type 2 diabetes mellitus without complications: Secondary | ICD-10-CM | POA: Diagnosis not present

## 2016-03-31 LAB — GLUCOSE, CAPILLARY: GLUCOSE-CAPILLARY: 126 mg/dL — AB (ref 65–99)

## 2016-04-01 DIAGNOSIS — E119 Type 2 diabetes mellitus without complications: Secondary | ICD-10-CM | POA: Diagnosis not present

## 2016-04-01 LAB — GLUCOSE, CAPILLARY: Glucose-Capillary: 100 mg/dL — ABNORMAL HIGH (ref 65–99)

## 2016-04-02 DIAGNOSIS — E119 Type 2 diabetes mellitus without complications: Secondary | ICD-10-CM | POA: Diagnosis not present

## 2016-04-02 LAB — GLUCOSE, CAPILLARY
Glucose-Capillary: 146 mg/dL — ABNORMAL HIGH (ref 65–99)
Glucose-Capillary: 107 mg/dL — ABNORMAL HIGH (ref 65–99)

## 2016-04-03 DIAGNOSIS — E119 Type 2 diabetes mellitus without complications: Secondary | ICD-10-CM | POA: Diagnosis not present

## 2016-04-03 LAB — GLUCOSE, CAPILLARY: Glucose-Capillary: 131 mg/dL — ABNORMAL HIGH (ref 65–99)

## 2016-04-04 DIAGNOSIS — E119 Type 2 diabetes mellitus without complications: Secondary | ICD-10-CM | POA: Diagnosis not present

## 2016-04-04 LAB — GLUCOSE, CAPILLARY: GLUCOSE-CAPILLARY: 127 mg/dL — AB (ref 65–99)

## 2016-04-05 DIAGNOSIS — E119 Type 2 diabetes mellitus without complications: Secondary | ICD-10-CM | POA: Diagnosis not present

## 2016-04-05 LAB — GLUCOSE, CAPILLARY: Glucose-Capillary: 148 mg/dL — ABNORMAL HIGH (ref 65–99)

## 2016-04-06 DIAGNOSIS — E119 Type 2 diabetes mellitus without complications: Secondary | ICD-10-CM | POA: Diagnosis not present

## 2016-04-06 LAB — GLUCOSE, CAPILLARY: GLUCOSE-CAPILLARY: 122 mg/dL — AB (ref 65–99)

## 2016-04-07 DIAGNOSIS — E119 Type 2 diabetes mellitus without complications: Secondary | ICD-10-CM | POA: Diagnosis not present

## 2016-04-07 LAB — GLUCOSE, CAPILLARY: GLUCOSE-CAPILLARY: 134 mg/dL — AB (ref 65–99)

## 2016-04-08 DIAGNOSIS — E119 Type 2 diabetes mellitus without complications: Secondary | ICD-10-CM | POA: Diagnosis not present

## 2016-04-08 LAB — GLUCOSE, CAPILLARY: GLUCOSE-CAPILLARY: 127 mg/dL — AB (ref 65–99)

## 2016-04-09 DIAGNOSIS — E119 Type 2 diabetes mellitus without complications: Secondary | ICD-10-CM | POA: Diagnosis not present

## 2016-04-09 LAB — GLUCOSE, CAPILLARY: GLUCOSE-CAPILLARY: 145 mg/dL — AB (ref 65–99)

## 2016-04-10 DIAGNOSIS — E119 Type 2 diabetes mellitus without complications: Secondary | ICD-10-CM | POA: Diagnosis not present

## 2016-04-10 LAB — GLUCOSE, CAPILLARY: GLUCOSE-CAPILLARY: 164 mg/dL — AB (ref 65–99)

## 2016-04-11 DIAGNOSIS — E119 Type 2 diabetes mellitus without complications: Secondary | ICD-10-CM | POA: Diagnosis not present

## 2016-04-11 LAB — GLUCOSE, CAPILLARY: Glucose-Capillary: 126 mg/dL — ABNORMAL HIGH (ref 65–99)

## 2016-04-12 DIAGNOSIS — E119 Type 2 diabetes mellitus without complications: Secondary | ICD-10-CM | POA: Diagnosis not present

## 2016-04-12 LAB — GLUCOSE, CAPILLARY
Glucose-Capillary: 116 mg/dL — ABNORMAL HIGH (ref 65–99)
Glucose-Capillary: 101 mg/dL — ABNORMAL HIGH (ref 65–99)

## 2016-04-13 ENCOUNTER — Encounter
Admission: RE | Admit: 2016-04-13 | Discharge: 2016-04-13 | Disposition: A | Discharge status: Home / Self Care | Payer: PPO | Source: Ambulatory Visit | Attending: Internal Medicine | Admitting: Internal Medicine

## 2016-04-13 DIAGNOSIS — E119 Type 2 diabetes mellitus without complications: Secondary | ICD-10-CM | POA: Diagnosis not present

## 2016-04-13 LAB — GLUCOSE, CAPILLARY: Glucose-Capillary: 117 mg/dL — ABNORMAL HIGH (ref 65–99)

## 2016-04-14 DIAGNOSIS — E119 Type 2 diabetes mellitus without complications: Secondary | ICD-10-CM | POA: Diagnosis not present

## 2016-04-14 LAB — GLUCOSE, CAPILLARY: Glucose-Capillary: 125 mg/dL — ABNORMAL HIGH (ref 65–99)

## 2016-04-15 DIAGNOSIS — E119 Type 2 diabetes mellitus without complications: Secondary | ICD-10-CM | POA: Diagnosis not present

## 2016-04-15 LAB — GLUCOSE, CAPILLARY: Glucose-Capillary: 121 mg/dL — ABNORMAL HIGH (ref 65–99)

## 2016-04-17 DIAGNOSIS — E119 Type 2 diabetes mellitus without complications: Secondary | ICD-10-CM | POA: Diagnosis not present

## 2016-04-17 LAB — GLUCOSE, CAPILLARY
Glucose-Capillary: 149 mg/dL — ABNORMAL HIGH (ref 65–99)
Glucose-Capillary: 112 mg/dL — ABNORMAL HIGH (ref 65–99)

## 2016-04-18 DIAGNOSIS — E119 Type 2 diabetes mellitus without complications: Secondary | ICD-10-CM | POA: Diagnosis not present

## 2016-04-18 LAB — GLUCOSE, CAPILLARY: Glucose-Capillary: 123 mg/dL — ABNORMAL HIGH (ref 65–99)

## 2016-04-19 DIAGNOSIS — E119 Type 2 diabetes mellitus without complications: Secondary | ICD-10-CM | POA: Diagnosis not present

## 2016-04-19 LAB — GLUCOSE, CAPILLARY: GLUCOSE-CAPILLARY: 108 mg/dL — AB (ref 65–99)

## 2016-04-20 DIAGNOSIS — E119 Type 2 diabetes mellitus without complications: Secondary | ICD-10-CM | POA: Diagnosis not present

## 2016-04-20 LAB — GLUCOSE, CAPILLARY: GLUCOSE-CAPILLARY: 162 mg/dL — AB (ref 65–99)

## 2016-04-21 DIAGNOSIS — E119 Type 2 diabetes mellitus without complications: Secondary | ICD-10-CM | POA: Diagnosis not present

## 2016-04-21 LAB — GLUCOSE, CAPILLARY
GLUCOSE-CAPILLARY: 122 mg/dL — AB (ref 65–99)
Glucose-Capillary: 106 mg/dL — ABNORMAL HIGH (ref 65–99)

## 2016-04-22 DIAGNOSIS — E119 Type 2 diabetes mellitus without complications: Secondary | ICD-10-CM | POA: Diagnosis not present

## 2016-04-24 DIAGNOSIS — E119 Type 2 diabetes mellitus without complications: Secondary | ICD-10-CM | POA: Diagnosis not present

## 2016-04-24 LAB — GLUCOSE, CAPILLARY
GLUCOSE-CAPILLARY: 122 mg/dL — AB (ref 65–99)
GLUCOSE-CAPILLARY: 153 mg/dL — AB (ref 65–99)

## 2016-04-25 LAB — GLUCOSE, CAPILLARY
GLUCOSE-CAPILLARY: 109 mg/dL — AB (ref 65–99)
Glucose-Capillary: 116 mg/dL — ABNORMAL HIGH (ref 65–99)

## 2016-04-26 DIAGNOSIS — E119 Type 2 diabetes mellitus without complications: Secondary | ICD-10-CM | POA: Diagnosis not present

## 2016-04-26 LAB — GLUCOSE, CAPILLARY: GLUCOSE-CAPILLARY: 105 mg/dL — AB (ref 65–99)

## 2016-04-27 DIAGNOSIS — E119 Type 2 diabetes mellitus without complications: Secondary | ICD-10-CM | POA: Diagnosis not present

## 2016-04-27 LAB — GLUCOSE, CAPILLARY: Glucose-Capillary: 123 mg/dL — ABNORMAL HIGH (ref 65–99)

## 2016-04-28 DIAGNOSIS — E119 Type 2 diabetes mellitus without complications: Secondary | ICD-10-CM | POA: Diagnosis not present

## 2016-04-28 LAB — GLUCOSE, CAPILLARY: GLUCOSE-CAPILLARY: 105 mg/dL — AB (ref 65–99)

## 2016-05-01 DIAGNOSIS — E119 Type 2 diabetes mellitus without complications: Secondary | ICD-10-CM | POA: Diagnosis not present

## 2016-05-01 LAB — GLUCOSE, CAPILLARY
GLUCOSE-CAPILLARY: 125 mg/dL — AB (ref 65–99)
GLUCOSE-CAPILLARY: 140 mg/dL — AB (ref 65–99)
GLUCOSE-CAPILLARY: 284 mg/dL — AB (ref 65–99)
Glucose-Capillary: 118 mg/dL — ABNORMAL HIGH (ref 65–99)

## 2016-05-02 DIAGNOSIS — E119 Type 2 diabetes mellitus without complications: Secondary | ICD-10-CM | POA: Diagnosis not present

## 2016-05-02 DIAGNOSIS — F339 Major depressive disorder, recurrent, unspecified: Secondary | ICD-10-CM | POA: Diagnosis not present

## 2016-05-02 DIAGNOSIS — F0150 Vascular dementia without behavioral disturbance: Secondary | ICD-10-CM | POA: Diagnosis not present

## 2016-05-02 LAB — GLUCOSE, CAPILLARY
GLUCOSE-CAPILLARY: 138 mg/dL — AB (ref 65–99)
Glucose-Capillary: 52 mg/dL — ABNORMAL LOW (ref 65–99)

## 2016-05-03 DIAGNOSIS — E119 Type 2 diabetes mellitus without complications: Secondary | ICD-10-CM | POA: Diagnosis not present

## 2016-05-03 LAB — GLUCOSE, CAPILLARY: Glucose-Capillary: 99 mg/dL (ref 65–99)

## 2016-05-05 DIAGNOSIS — E119 Type 2 diabetes mellitus without complications: Secondary | ICD-10-CM | POA: Diagnosis not present

## 2016-05-05 LAB — GLUCOSE, CAPILLARY
Glucose-Capillary: 108 mg/dL — ABNORMAL HIGH (ref 65–99)
Glucose-Capillary: 101 mg/dL — ABNORMAL HIGH (ref 65–99)

## 2016-05-07 DIAGNOSIS — E119 Type 2 diabetes mellitus without complications: Secondary | ICD-10-CM | POA: Diagnosis not present

## 2016-05-07 LAB — GLUCOSE, CAPILLARY: Glucose-Capillary: 109 mg/dL — ABNORMAL HIGH (ref 65–99)

## 2016-05-09 DIAGNOSIS — E119 Type 2 diabetes mellitus without complications: Secondary | ICD-10-CM | POA: Diagnosis not present

## 2016-05-09 LAB — GLUCOSE, CAPILLARY: Glucose-Capillary: 95 mg/dL (ref 65–99)

## 2016-05-10 DIAGNOSIS — E119 Type 2 diabetes mellitus without complications: Secondary | ICD-10-CM | POA: Diagnosis not present

## 2016-05-10 LAB — GLUCOSE, CAPILLARY: Glucose-Capillary: 127 mg/dL — ABNORMAL HIGH (ref 65–99)

## 2016-05-11 DIAGNOSIS — E119 Type 2 diabetes mellitus without complications: Secondary | ICD-10-CM | POA: Diagnosis not present

## 2016-05-11 LAB — GLUCOSE, CAPILLARY: Glucose-Capillary: 141 mg/dL — ABNORMAL HIGH (ref 65–99)

## 2016-05-12 DIAGNOSIS — E119 Type 2 diabetes mellitus without complications: Secondary | ICD-10-CM | POA: Diagnosis not present

## 2016-05-12 LAB — GLUCOSE, CAPILLARY: Glucose-Capillary: 103 mg/dL — ABNORMAL HIGH (ref 65–99)

## 2016-05-13 DIAGNOSIS — E119 Type 2 diabetes mellitus without complications: Secondary | ICD-10-CM | POA: Diagnosis not present

## 2016-05-13 LAB — GLUCOSE, CAPILLARY: Glucose-Capillary: 112 mg/dL — ABNORMAL HIGH (ref 65–99)

## 2016-05-14 ENCOUNTER — Encounter
Admission: RE | Admit: 2016-05-14 | Discharge: 2016-05-14 | Disposition: A | Discharge status: Home / Self Care | Payer: PPO | Source: Ambulatory Visit | Attending: Internal Medicine | Admitting: Internal Medicine

## 2016-05-14 DIAGNOSIS — E119 Type 2 diabetes mellitus without complications: Secondary | ICD-10-CM | POA: Diagnosis not present

## 2016-05-15 LAB — GLUCOSE, CAPILLARY: GLUCOSE-CAPILLARY: 98 mg/dL (ref 65–99)

## 2016-05-17 ENCOUNTER — Non-Acute Institutional Stay: Payer: PPO | Admitting: Gerontology

## 2016-05-17 DIAGNOSIS — M810 Age-related osteoporosis without current pathological fracture: Secondary | ICD-10-CM

## 2016-05-17 DIAGNOSIS — I10 Essential (primary) hypertension: Secondary | ICD-10-CM

## 2016-05-17 DIAGNOSIS — I447 Left bundle-branch block, unspecified: Secondary | ICD-10-CM

## 2016-05-17 DIAGNOSIS — F0150 Vascular dementia without behavioral disturbance: Secondary | ICD-10-CM

## 2016-05-17 DIAGNOSIS — H409 Unspecified glaucoma: Secondary | ICD-10-CM | POA: Diagnosis not present

## 2016-05-17 DIAGNOSIS — E1139 Type 2 diabetes mellitus with other diabetic ophthalmic complication: Secondary | ICD-10-CM | POA: Diagnosis not present

## 2016-05-17 DIAGNOSIS — E119 Type 2 diabetes mellitus without complications: Secondary | ICD-10-CM | POA: Diagnosis not present

## 2016-05-17 DIAGNOSIS — K5900 Constipation, unspecified: Secondary | ICD-10-CM

## 2016-05-17 DIAGNOSIS — I5022 Chronic systolic (congestive) heart failure: Secondary | ICD-10-CM | POA: Diagnosis not present

## 2016-05-17 DIAGNOSIS — F329 Major depressive disorder, single episode, unspecified: Secondary | ICD-10-CM

## 2016-05-17 LAB — GLUCOSE, CAPILLARY: Glucose-Capillary: 117 mg/dL — ABNORMAL HIGH (ref 65–99)

## 2016-05-17 NOTE — Progress Notes (Signed)
Location:      Place of Service:  ALF (13) Provider:  Toni Arthurs, NP-C  Crecencio Mc, MD  Patient Care Team: Crecencio Mc, MD as PCP - General (Internal Medicine)  Extended Emergency Contact Information Primary Emergency Contact: Tagle,Richard Address: [address], [place] [id number] Johnnette Litter of [place] Phone: [phone] Relation: Son Secondary Emergency Contact: Brush of [place] Phone: [phone] Relation: Son  Code Status:  DNR Goals of care: Advanced Directive information Advanced Directives 02/12/2016  Does Patient Have a Medical Advance Directive? No  Type of Advance Directive -  Does patient want to make changes to medical advance directive? -  Copy of [place] in Chart? -     Chief Complaint  Patient presents with  . Medical Management of Chronic Issues    HPI:  Pt is a 82 y.o. female seen today for medical management of chronic diseases. Pt has been stable over this past month. No falls. Pt is continent of B&B. Needs minimal assistance with ADLs. Enjoys being with other residents. Appetite good. Pt says she doesn't sleep at night, but pt does not c/o fatigue. Staff reports she sleeps well. Regular BMs. Poor eye sight. Mobile on unit without use of assistive devices. VSS. No other complaints.     Past Medical History:  Diagnosis Date  . Diabetes mellitus    diet controlled  . Glaucoma   . Hypertension   . Vascular dementia    Past Surgical History:  Procedure Laterality Date  . CATARACT EXTRACTION      Allergies  Allergen Reactions  . Sulfa Antibiotics     Allergies as of 05/17/2016      Reactions   Sulfa Antibiotics       Medication List       Accurate as of 05/17/16  2:43 PM. Always use your most recent med list.          ACCU-CHEK SOFTCLIX LANCETS lancets 1 each by Other route daily. Use as instructed   amLODipine 5 MG tablet Commonly known as:  NORVASC Take 5 mg  by mouth daily.   amoxicillin-clavulanate 500-125 MG tablet Commonly known as:  AUGMENTIN Take 1 tablet by mouth 2 (two) times daily.   aspirin EC 81 MG tablet Take 81 mg by mouth daily.   baclofen 10 MG tablet Commonly known as:  LIORESAL Take 1 tablet (10 mg total) by mouth 3 (three) times daily as needed for muscle spasms.   brinzolamide 1 % ophthalmic suspension Commonly known as:  AZOPT 1 drop 2 (two) times daily.   BYSTOLIC 20 MG Tabs Generic drug:  Nebivolol HCl Take 1 tablet by mouth daily   docusate sodium 100 MG capsule Commonly known as:  COLACE Take 2 capsules (200 mg total) by mouth daily. At bedtime   fish oil-omega-3 fatty acids 1000 MG capsule Take 1,000 mg by mouth as needed. Reported on 08/23/2015   glipiZIDE 5 MG tablet Commonly known as:  GLUCOTROL Take 1 tablet by mouth every morning before breakfast   glucose blood test strip Commonly known as:  ACCU-CHEK AVIVA Check blood sugar twice daily Dx 250.00   ICAPS PO Take by mouth 2 (two) times daily.   losartan 100 MG tablet Commonly known as:  COZAAR Take 1 tablet by mouth daily   Melatonin 5 MG Tabs 1 tablet daily at 9 pm for insomnia   polyethylene glycol powder powder  Commonly known as:  GLYCOLAX/MIRALAX Take 17 g by mouth daily. After dinner with a full glass of water   traMADol 50 MG tablet Commonly known as:  ULTRAM Take 1 tablet (50 mg total) by mouth every 6 (six) hours as needed.   Vitamin D (Ergocalciferol) 50000 units Caps capsule Commonly known as:  DRISDOL TAKE 1 CAPSULE ONE TIME WEEKLY       Review of Systems  Unable to perform ROS: Mental status change  Constitutional: Negative for activity change, appetite change, chills, diaphoresis, fatigue and fever.  HENT: Negative for congestion, sneezing, sore throat, trouble swallowing and voice change.   Eyes: Negative.   Respiratory: Negative for apnea, cough, choking, chest tightness, shortness of breath and wheezing.     Cardiovascular: Negative for chest pain, palpitations and leg swelling.  Gastrointestinal: Negative for abdominal distention, abdominal pain, constipation, diarrhea and nausea.  Endocrine: Negative.   Genitourinary: Negative for difficulty urinating, dysuria, frequency and urgency.  Musculoskeletal: Negative for back pain, gait problem and myalgias. Arthralgias: typical arthritis.  Skin: Negative for color change, pallor, rash and wound.  Allergic/Immunologic: Negative.   Neurological: Negative for dizziness, tremors, seizures, syncope, facial asymmetry, speech difficulty, weakness, numbness and headaches.  Hematological: Negative.   Psychiatric/Behavioral: Negative for agitation and behavioral problems.  All other systems reviewed and are negative.   Immunization History  Administered Date(s) Administered  . Influenza Split 11/30/2011, 12/05/2012, 11/13/2013  . Influenza-Unspecified 11/16/2014  . Pneumococcal Conjugate-13 04/23/2013  . Pneumococcal Polysaccharide-23 10/06/2011  . Tdap 09/15/2014  . Zoster 04/23/2005   Pertinent  Health Maintenance Due  Topic Date Due  . HEMOGLOBIN A1C  12/09/2015  . FOOT EXAM  03/07/2016  . INFLUENZA VACCINE  12/23/2016 (Originally 09/13/2016)  . OPHTHALMOLOGY EXAM  10/19/2016  . DEXA SCAN  Completed  . PNA vac Low Risk Adult  Completed   Fall Risk  02/16/2015 09/17/2014 11/08/2013 12/17/2012  Falls in the past year? No No No No   Functional Status Survey:    Vitals:   05/17/16 0840  BP: (!) 161/84  Pulse: 79   There is no height or weight on file to calculate BMI. Physical Exam  Constitutional: She is oriented to person, place, and time. Vital signs are normal. She appears well-developed and well-nourished. She appears lethargic. She is active and cooperative. She does not appear ill. No distress.  HENT:  Head: Normocephalic and atraumatic.  Mouth/Throat: Uvula is midline, oropharynx is clear and moist and mucous membranes are normal.  Mucous membranes are not pale, not dry and not cyanotic.  Eyes: Conjunctivae, EOM and lids are normal. Pupils are equal, round, and reactive to light.  Neck: Trachea normal, normal range of motion and full passive range of motion without pain. Neck supple. No JVD present. No tracheal deviation, no edema and no erythema present. No thyromegaly present.  Cardiovascular: Normal rate, regular rhythm, normal heart sounds, intact distal pulses and normal pulses.  Exam reveals no gallop, no distant heart sounds and no friction rub.   No murmur heard. Pulmonary/Chest: Effort normal. No accessory muscle usage. No respiratory distress. She has decreased breath sounds in the right lower field and the left lower field. She has no wheezes. She has no rhonchi. She has no rales. She exhibits no tenderness.  Abdominal: Soft. Normal appearance and bowel sounds are normal. She exhibits no distension and no ascites. There is no tenderness.  Musculoskeletal: Normal range of motion. She exhibits no edema or tenderness.  Expected osteoarthritis, stiffness  Neurological:  She is oriented to person, place, and time. She has normal strength. She appears lethargic.  Skin: Skin is warm, dry and intact. No rash noted. She is not diaphoretic. No cyanosis or erythema. No pallor. Nails show no clubbing.  Psychiatric: She has a normal mood and affect. Her speech is normal and behavior is normal. Judgment and thought content normal. Cognition and memory are normal.  Nursing note and vitals reviewed.   Labs reviewed:  Recent Labs  08/23/15 1617 02/10/16 1400 02/12/16 1245  NA 134* 137 138  K 4.4 3.6 3.9  CL 99 99* 100*  CO2 28 27 27   GLUCOSE 152* 108* 99  BUN 27* 38* 27*  CREATININE 0.79 0.68 0.74  CALCIUM 9.8 9.4 9.2  MG  --  2.2  --     Recent Labs  08/23/15 1617 02/10/16 1400 02/12/16 1245  AST 20 17 24   ALT 21 16 14   ALKPHOS 83 72 64  BILITOT 0.8 1.9* 1.8*  PROT 7.3 7.5 7.3  ALBUMIN 4.0 4.1 4.0     Recent Labs  08/23/15 1617 02/10/16 1600 02/12/16 1245  WBC 13.1* 11.9* 10.6  NEUTROABS 10.6* 10.0*  --   HGB 12.7 13.4 13.8  HCT 38.2 39.5 41.1  MCV 87.4 86.8 87.5  PLT 300.0 286 281   Lab Results  Component Value Date   TSH 0.843 02/10/2016   Lab Results  Component Value Date   HGBA1C 7.3 (H) 06/09/2015   Lab Results  Component Value Date   CHOL 201 (H) 06/09/2015   HDL 81.90 06/09/2015   LDLCALC 103 (H) 06/09/2015   LDLDIRECT 97.0 06/09/2015   TRIG 81.0 06/09/2015   CHOLHDL 2 06/09/2015    Significant Diagnostic Results in last 30 days:  No results found.  Assessment/Plan 1. Essential hypertension  Stable  Continue Amlodipine 5 mg po Q Day  Continue ASA 81 mg po Q Day  Continue Bystolic 20 mg po Q Day  Continue Losartan 100 mg po Q day  2. Vascular dementia, uncomplicated  Stable  3. Major depressive disorder with single episode, remission status unspecified  Stable  Continue Sertraline 50 mg po Q Day  4. Glaucoma of both eyes, unspecified glaucoma type  Stable  Continue Azopt 1% 1 drop in both eyes Q 12 hours  5. Age-related osteoporosis without current pathological fracture  Stable  Continue Cholecalciferol 1,000 units po Q Day  6. 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 Constipation, unspecified constipation type  Stable  Continue Miralx 17 g PO Q day- mix with 4-8 ounces of fluid  8. Left bundle branch block  Stable  9. Chronic systolic congestive heart failure (HCC)  Stable  Family/ staff Communication:   Total Time:  Documentation:  Face to Face:  Family/Phone:   Labs/tests ordered:    Medication list reviewed and assessed for continued appropriateness. Monthly medication orders reviewed and signed.  Vikki Ports, NP-C Geriatrics [facility] [phone], [place] [address] Cell Phone (Mon-Fri 8am-5pm):  [phone] On Call:  [phone] & follow prompts after 5pm & weekends Office Phone:  [phone] Office Fax:  [phone]

## 2016-05-18 DIAGNOSIS — E119 Type 2 diabetes mellitus without complications: Secondary | ICD-10-CM | POA: Diagnosis not present

## 2016-05-18 LAB — GLUCOSE, CAPILLARY: Glucose-Capillary: 176 mg/dL — ABNORMAL HIGH (ref 65–99)

## 2016-05-19 DIAGNOSIS — E119 Type 2 diabetes mellitus without complications: Secondary | ICD-10-CM | POA: Diagnosis not present

## 2016-05-19 LAB — GLUCOSE, CAPILLARY: Glucose-Capillary: 102 mg/dL — ABNORMAL HIGH (ref 65–99)

## 2016-05-21 DIAGNOSIS — E119 Type 2 diabetes mellitus without complications: Secondary | ICD-10-CM | POA: Diagnosis not present

## 2016-05-21 LAB — GLUCOSE, CAPILLARY: Glucose-Capillary: 123 mg/dL — ABNORMAL HIGH (ref 65–99)

## 2016-05-22 DIAGNOSIS — E119 Type 2 diabetes mellitus without complications: Secondary | ICD-10-CM | POA: Diagnosis not present

## 2016-05-22 LAB — GLUCOSE, CAPILLARY
GLUCOSE-CAPILLARY: 114 mg/dL — AB (ref 65–99)
GLUCOSE-CAPILLARY: 98 mg/dL (ref 65–99)

## 2016-05-23 DIAGNOSIS — E119 Type 2 diabetes mellitus without complications: Secondary | ICD-10-CM | POA: Diagnosis not present

## 2016-05-23 LAB — GLUCOSE, CAPILLARY: GLUCOSE-CAPILLARY: 103 mg/dL — AB (ref 65–99)

## 2016-05-24 DIAGNOSIS — E119 Type 2 diabetes mellitus without complications: Secondary | ICD-10-CM | POA: Diagnosis not present

## 2016-05-24 LAB — GLUCOSE, CAPILLARY: GLUCOSE-CAPILLARY: 107 mg/dL — AB (ref 65–99)

## 2016-05-25 DIAGNOSIS — E119 Type 2 diabetes mellitus without complications: Secondary | ICD-10-CM | POA: Diagnosis not present

## 2016-05-25 LAB — GLUCOSE, CAPILLARY: Glucose-Capillary: 127 mg/dL — ABNORMAL HIGH (ref 65–99)

## 2016-05-26 DIAGNOSIS — E119 Type 2 diabetes mellitus without complications: Secondary | ICD-10-CM | POA: Diagnosis not present

## 2016-05-26 LAB — GLUCOSE, CAPILLARY: Glucose-Capillary: 113 mg/dL — ABNORMAL HIGH (ref 65–99)

## 2016-05-27 DIAGNOSIS — E119 Type 2 diabetes mellitus without complications: Secondary | ICD-10-CM | POA: Diagnosis not present

## 2016-05-27 LAB — GLUCOSE, CAPILLARY: GLUCOSE-CAPILLARY: 115 mg/dL — AB (ref 65–99)

## 2016-05-29 DIAGNOSIS — E119 Type 2 diabetes mellitus without complications: Secondary | ICD-10-CM | POA: Diagnosis not present

## 2016-05-29 LAB — GLUCOSE, CAPILLARY
GLUCOSE-CAPILLARY: 105 mg/dL — AB (ref 65–99)
GLUCOSE-CAPILLARY: 125 mg/dL — AB (ref 65–99)

## 2016-05-30 DIAGNOSIS — E119 Type 2 diabetes mellitus without complications: Secondary | ICD-10-CM | POA: Diagnosis not present

## 2016-05-30 LAB — GLUCOSE, CAPILLARY: GLUCOSE-CAPILLARY: 115 mg/dL — AB (ref 65–99)

## 2016-05-31 DIAGNOSIS — E119 Type 2 diabetes mellitus without complications: Secondary | ICD-10-CM | POA: Diagnosis not present

## 2016-05-31 LAB — GLUCOSE, CAPILLARY: Glucose-Capillary: 147 mg/dL — ABNORMAL HIGH (ref 65–99)

## 2016-06-01 DIAGNOSIS — E119 Type 2 diabetes mellitus without complications: Secondary | ICD-10-CM | POA: Diagnosis not present

## 2016-06-01 LAB — GLUCOSE, CAPILLARY: GLUCOSE-CAPILLARY: 112 mg/dL — AB (ref 65–99)

## 2016-06-02 DIAGNOSIS — E119 Type 2 diabetes mellitus without complications: Secondary | ICD-10-CM | POA: Diagnosis not present

## 2016-06-02 LAB — GLUCOSE, CAPILLARY: Glucose-Capillary: 119 mg/dL — ABNORMAL HIGH (ref 65–99)

## 2016-06-03 DIAGNOSIS — E119 Type 2 diabetes mellitus without complications: Secondary | ICD-10-CM | POA: Diagnosis not present

## 2016-06-03 LAB — GLUCOSE, CAPILLARY: GLUCOSE-CAPILLARY: 121 mg/dL — AB (ref 65–99)

## 2016-06-05 DIAGNOSIS — E119 Type 2 diabetes mellitus without complications: Secondary | ICD-10-CM | POA: Diagnosis not present

## 2016-06-05 LAB — GLUCOSE, CAPILLARY: Glucose-Capillary: 133 mg/dL — ABNORMAL HIGH (ref 65–99)

## 2016-06-06 DIAGNOSIS — E119 Type 2 diabetes mellitus without complications: Secondary | ICD-10-CM | POA: Diagnosis not present

## 2016-06-06 LAB — GLUCOSE, CAPILLARY: GLUCOSE-CAPILLARY: 113 mg/dL — AB (ref 65–99)

## 2016-06-07 DIAGNOSIS — E119 Type 2 diabetes mellitus without complications: Secondary | ICD-10-CM | POA: Diagnosis not present

## 2016-06-07 DIAGNOSIS — H401134 Primary open-angle glaucoma, bilateral, indeterminate stage: Secondary | ICD-10-CM | POA: Diagnosis not present

## 2016-06-07 LAB — HM DIABETES EYE EXAM

## 2016-06-07 LAB — GLUCOSE, CAPILLARY: Glucose-Capillary: 120 mg/dL — ABNORMAL HIGH (ref 65–99)

## 2016-06-08 DIAGNOSIS — E119 Type 2 diabetes mellitus without complications: Secondary | ICD-10-CM | POA: Diagnosis not present

## 2016-06-08 LAB — GLUCOSE, CAPILLARY: Glucose-Capillary: 112 mg/dL — ABNORMAL HIGH (ref 65–99)

## 2016-06-09 DIAGNOSIS — E119 Type 2 diabetes mellitus without complications: Secondary | ICD-10-CM | POA: Diagnosis not present

## 2016-06-09 LAB — GLUCOSE, CAPILLARY: Glucose-Capillary: 92 mg/dL (ref 65–99)

## 2016-06-10 DIAGNOSIS — E119 Type 2 diabetes mellitus without complications: Secondary | ICD-10-CM | POA: Diagnosis not present

## 2016-06-10 LAB — GLUCOSE, CAPILLARY: Glucose-Capillary: 115 mg/dL — ABNORMAL HIGH (ref 65–99)

## 2016-06-11 DIAGNOSIS — E119 Type 2 diabetes mellitus without complications: Secondary | ICD-10-CM | POA: Diagnosis not present

## 2016-06-11 LAB — GLUCOSE, CAPILLARY: Glucose-Capillary: 93 mg/dL (ref 65–99)

## 2016-06-13 ENCOUNTER — Encounter
Admission: RE | Admit: 2016-06-13 | Discharge: 2016-06-13 | Disposition: A | Discharge status: Home / Self Care | Payer: PPO | Source: Ambulatory Visit | Attending: Internal Medicine | Admitting: Internal Medicine

## 2016-06-13 DIAGNOSIS — E119 Type 2 diabetes mellitus without complications: Secondary | ICD-10-CM | POA: Insufficient documentation

## 2016-06-13 DIAGNOSIS — I10 Essential (primary) hypertension: Secondary | ICD-10-CM | POA: Insufficient documentation

## 2016-06-15 DIAGNOSIS — I10 Essential (primary) hypertension: Secondary | ICD-10-CM | POA: Diagnosis not present

## 2016-06-15 DIAGNOSIS — E119 Type 2 diabetes mellitus without complications: Secondary | ICD-10-CM | POA: Diagnosis not present

## 2016-06-15 LAB — GLUCOSE, CAPILLARY
GLUCOSE-CAPILLARY: 105 mg/dL — AB (ref 65–99)
Glucose-Capillary: 97 mg/dL (ref 65–99)

## 2016-06-16 DIAGNOSIS — I10 Essential (primary) hypertension: Secondary | ICD-10-CM | POA: Diagnosis not present

## 2016-06-16 LAB — GLUCOSE, CAPILLARY: Glucose-Capillary: 115 mg/dL — ABNORMAL HIGH (ref 65–99)

## 2016-06-19 DIAGNOSIS — I10 Essential (primary) hypertension: Secondary | ICD-10-CM | POA: Diagnosis not present

## 2016-06-19 LAB — GLUCOSE, CAPILLARY
GLUCOSE-CAPILLARY: 122 mg/dL — AB (ref 65–99)
Glucose-Capillary: 112 mg/dL — ABNORMAL HIGH (ref 65–99)

## 2016-06-21 DIAGNOSIS — I10 Essential (primary) hypertension: Secondary | ICD-10-CM | POA: Diagnosis not present

## 2016-06-21 LAB — GLUCOSE, CAPILLARY: Glucose-Capillary: 119 mg/dL — ABNORMAL HIGH (ref 65–99)

## 2016-06-22 DIAGNOSIS — I10 Essential (primary) hypertension: Secondary | ICD-10-CM | POA: Diagnosis not present

## 2016-06-22 LAB — GLUCOSE, CAPILLARY: Glucose-Capillary: 161 mg/dL — ABNORMAL HIGH (ref 65–99)

## 2016-06-23 DIAGNOSIS — I10 Essential (primary) hypertension: Secondary | ICD-10-CM | POA: Diagnosis not present

## 2016-06-23 LAB — GLUCOSE, CAPILLARY: GLUCOSE-CAPILLARY: 117 mg/dL — AB (ref 65–99)

## 2016-06-26 DIAGNOSIS — I10 Essential (primary) hypertension: Secondary | ICD-10-CM | POA: Diagnosis not present

## 2016-06-26 LAB — GLUCOSE, CAPILLARY
Glucose-Capillary: 112 mg/dL — ABNORMAL HIGH (ref 65–99)
Glucose-Capillary: 128 mg/dL — ABNORMAL HIGH (ref 65–99)
Glucose-Capillary: 136 mg/dL — ABNORMAL HIGH (ref 65–99)

## 2016-06-27 DIAGNOSIS — I10 Essential (primary) hypertension: Secondary | ICD-10-CM | POA: Diagnosis not present

## 2016-06-27 LAB — GLUCOSE, CAPILLARY: GLUCOSE-CAPILLARY: 151 mg/dL — AB (ref 65–99)

## 2016-06-28 DIAGNOSIS — I10 Essential (primary) hypertension: Secondary | ICD-10-CM | POA: Diagnosis not present

## 2016-06-28 LAB — GLUCOSE, CAPILLARY: Glucose-Capillary: 102 mg/dL — ABNORMAL HIGH (ref 65–99)

## 2016-06-29 DIAGNOSIS — I10 Essential (primary) hypertension: Secondary | ICD-10-CM | POA: Diagnosis not present

## 2016-06-29 LAB — GLUCOSE, CAPILLARY
Glucose-Capillary: 127 mg/dL — ABNORMAL HIGH (ref 65–99)
Glucose-Capillary: 97 mg/dL (ref 65–99)

## 2016-06-30 ENCOUNTER — Non-Acute Institutional Stay: Payer: PPO | Admitting: Gerontology

## 2016-06-30 DIAGNOSIS — R600 Localized edema: Secondary | ICD-10-CM

## 2016-06-30 DIAGNOSIS — I10 Essential (primary) hypertension: Secondary | ICD-10-CM | POA: Diagnosis not present

## 2016-06-30 LAB — GLUCOSE, CAPILLARY: Glucose-Capillary: 95 mg/dL (ref 65–99)

## 2016-07-03 DIAGNOSIS — I10 Essential (primary) hypertension: Secondary | ICD-10-CM | POA: Diagnosis not present

## 2016-07-03 LAB — GLUCOSE, CAPILLARY
Glucose-Capillary: 128 mg/dL — ABNORMAL HIGH (ref 65–99)
Glucose-Capillary: 112 mg/dL — ABNORMAL HIGH (ref 65–99)

## 2016-07-04 DIAGNOSIS — I10 Essential (primary) hypertension: Secondary | ICD-10-CM | POA: Diagnosis not present

## 2016-07-04 LAB — GLUCOSE, CAPILLARY: GLUCOSE-CAPILLARY: 107 mg/dL — AB (ref 65–99)

## 2016-07-04 NOTE — Progress Notes (Signed)
Location:      Place of Service:  ALF (13) Provider:  Toni Arthurs, NP-C  Crecencio Mc, MD  Patient Care Team: Crecencio Mc, MD as PCP - General (Internal Medicine)  Extended Emergency Contact Information Primary Emergency Contact: Sethi,Richard Address: [address], [place] [id number] Johnnette Litter of [place] Phone: [phone] Relation: Son Secondary Emergency Contact: [place] of [place] Phone: [phone] Relation: Son  Code Status:  DNR Goals of care: Advanced Directive information Advanced Directives 02/12/2016  Does Patient Have a Medical Advance Directive? No  Type of Advance Directive -  Does patient want to make changes to medical advance directive? -  Copy of [place] in Chart? -     Chief Complaint  Patient presents with  . Medical Management of Chronic Issues    HPI:  Pt is a 83 y.o. female seen today for an acute visit for BLE edema. Staff report pt has had increased edema over the past few days. Pt denies negative effects from the the edema. Pt denies chest pains or shortness of breath. Pt sits in the chair with her legs in the dependent position most of the day. She rarely elevates the legs. Pt refused to wear TED Hose when asked if she would wear them. BLE with 2+ B- pitting edema with defined sock line. Tenderness to legs when pressed. Negative Homan's sign. Otherwise, pt reports she is feeling fine. VSS. No other complaints.    Past Medical History:  Diagnosis Date  . Diabetes mellitus    diet controlled  . Glaucoma   . Hypertension   . Vascular dementia    Past Surgical History:  Procedure Laterality Date  . CATARACT EXTRACTION      Allergies  Allergen Reactions  . Sulfa Antibiotics     Allergies as of 06/30/2016      Reactions   Sulfa Antibiotics       Medication List       Accurate as of 06/30/16 11:59 PM. Always use your most recent med list.          ACCU-CHEK  SOFTCLIX LANCETS lancets 1 each by Other route daily. Use as instructed   amLODipine 5 MG tablet Commonly known as:  NORVASC Take 5 mg by mouth daily.   amoxicillin-clavulanate 500-125 MG tablet Commonly known as:  AUGMENTIN Take 1 tablet by mouth 2 (two) times daily.   aspirin EC 81 MG tablet Take 81 mg by mouth daily.   baclofen 10 MG tablet Commonly known as:  LIORESAL Take 1 tablet (10 mg total) by mouth 3 (three) times daily as needed for muscle spasms.   brinzolamide 1 % ophthalmic suspension Commonly known as:  AZOPT 1 drop 2 (two) times daily.   BYSTOLIC 20 MG Tabs Generic drug:  Nebivolol HCl Take 1 tablet by mouth daily   docusate sodium 100 MG capsule Commonly known as:  COLACE Take 2 capsules (200 mg total) by mouth daily. At bedtime   fish oil-omega-3 fatty acids 1000 MG capsule Take 1,000 mg by mouth as needed. Reported on 08/23/2015   glipiZIDE 5 MG tablet Commonly known as:  GLUCOTROL Take 1 tablet by mouth every morning before breakfast   glucose blood test strip Commonly known as:  ACCU-CHEK AVIVA Check blood sugar twice daily Dx 250.00   ICAPS PO Take by mouth 2 (two) times daily.   losartan 100 MG tablet Commonly known as:  COZAAR Take 1 tablet by mouth daily   Melatonin 5 MG Tabs 1 tablet daily at 9 pm for insomnia   polyethylene glycol powder powder Commonly known as:  GLYCOLAX/MIRALAX Take 17 g by mouth daily. After dinner with a full glass of water   traMADol 50 MG tablet Commonly known as:  ULTRAM Take 1 tablet (50 mg total) by mouth every 6 (six) hours as needed.   Vitamin D (Ergocalciferol) 50000 units Caps capsule Commonly known as:  DRISDOL TAKE 1 CAPSULE ONE TIME WEEKLY       Review of Systems  Unable to perform ROS: Dementia  Constitutional: Negative for activity change, appetite change, chills, diaphoresis, fatigue and fever.  HENT: Negative for congestion, sneezing, sore throat, trouble swallowing and voice change.     Eyes: Negative.   Respiratory: Negative for apnea, cough, choking, chest tightness, shortness of breath and wheezing.   Cardiovascular: Positive for leg swelling. Negative for chest pain and palpitations.  Gastrointestinal: Negative for abdominal distention, abdominal pain, constipation, diarrhea and nausea.  Endocrine: Negative.   Genitourinary: Negative for difficulty urinating, dysuria, frequency and urgency.  Musculoskeletal: Negative for back pain, gait problem and myalgias. Arthralgias: typical arthritis.  Skin: Negative for color change, pallor, rash and wound.  Allergic/Immunologic: Negative.   Neurological: Negative for dizziness, tremors, seizures, syncope, facial asymmetry, speech difficulty, weakness, numbness and headaches.  Hematological: Negative.   Psychiatric/Behavioral: Negative for agitation and behavioral problems.  All other systems reviewed and are negative.   Immunization History  Administered Date(s) Administered  . Influenza Split 11/30/2011, 12/05/2012, 11/13/2013  . Influenza-Unspecified 11/16/2014  . Pneumococcal Conjugate-13 04/23/2013  . Pneumococcal Polysaccharide-23 10/06/2011  . Tdap 09/15/2014  . Zoster 04/23/2005   Pertinent  Health Maintenance Due  Topic Date Due  . HEMOGLOBIN A1C  12/09/2015  . FOOT EXAM  03/07/2016  . INFLUENZA VACCINE  12/23/2016 (Originally 09/13/2016)  . OPHTHALMOLOGY EXAM  06/07/2017  . DEXA SCAN  Completed  . PNA vac Low Risk Adult  Completed   Fall Risk  02/16/2015 09/17/2014 11/08/2013 12/17/2012  Falls in the past year? No No No No   Functional Status Survey:    Vitals:   06/30/16 0900  BP: (!) 132/56  Pulse: 75   There is no height or weight on file to calculate BMI. Physical Exam  Constitutional: She is oriented to person, place, and time. Vital signs are normal. She appears well-developed and well-nourished. She is active and cooperative. She does not appear ill. No distress.  HENT:  Head: Normocephalic and  atraumatic.  Mouth/Throat: Uvula is midline, oropharynx is clear and moist and mucous membranes are normal. Mucous membranes are not pale, not dry and not cyanotic.  Eyes: Conjunctivae, EOM and lids are normal. Pupils are equal, round, and reactive to light.  Neck: Trachea normal, normal range of motion and full passive range of motion without pain. Neck supple. No JVD present. No tracheal deviation, no edema and no erythema present. No thyromegaly present.  Cardiovascular: Normal rate, regular rhythm, normal heart sounds, intact distal pulses and normal pulses.  Exam reveals no gallop, no distant heart sounds and no friction rub.   No murmur heard. Pulses:      Dorsalis pedis pulses are 2+ on the right side, and 2+ on the left side.  2+ BLE edema   Pulmonary/Chest: Effort normal and breath sounds normal. No accessory muscle usage. No respiratory distress. She has no decreased breath sounds. She has no wheezes. She has no rhonchi. She  has no rales. She exhibits no tenderness.  Abdominal: Normal appearance and bowel sounds are normal. She exhibits no distension and no ascites. There is no tenderness.  Musculoskeletal: Normal range of motion. She exhibits no edema or tenderness.  Expected osteoarthritis, stiffness  Neurological: She is alert and oriented to person, place, and time. She has normal strength.  Skin: Skin is warm, dry and intact. No rash noted. She is not diaphoretic. No cyanosis or erythema. No pallor. Nails show no clubbing.  Psychiatric: She has a normal mood and affect. Her speech is normal and behavior is normal. Judgment and thought content normal. Cognition and memory are normal.  Nursing note and vitals reviewed.   Labs reviewed:  Recent Labs  08/23/15 1617 02/10/16 1400 02/12/16 1245  NA 134* 137 138  K 4.4 3.6 3.9  CL 99 99* 100*  CO2 28 27 27   GLUCOSE 152* 108* 99  BUN 27* 38* 27*  CREATININE 0.79 0.68 0.74  CALCIUM 9.8 9.4 9.2  MG  --  2.2  --     Recent  Labs  08/23/15 1617 02/10/16 1400 02/12/16 1245  AST 20 17 24   ALT 21 16 14   ALKPHOS 83 72 64  BILITOT 0.8 1.9* 1.8*  PROT 7.3 7.5 7.3  ALBUMIN 4.0 4.1 4.0    Recent Labs  08/23/15 1617 02/10/16 1600 02/12/16 1245  WBC 13.1* 11.9* 10.6  NEUTROABS 10.6* 10.0*  --   HGB 12.7 13.4 13.8  HCT 38.2 39.5 41.1  MCV 87.4 86.8 87.5  PLT 300.0 286 281   Lab Results  Component Value Date   TSH 0.843 02/10/2016   Lab Results  Component Value Date   HGBA1C 7.3 (H) 06/09/2015   Lab Results  Component Value Date   CHOL 201 (H) 06/09/2015   HDL 81.90 06/09/2015   LDLCALC 103 (H) 06/09/2015   LDLDIRECT 97.0 06/09/2015   TRIG 81.0 06/09/2015   CHOLHDL 2 06/09/2015    Significant Diagnostic Results in last 30 days:  No results found.  Assessment/Plan 1. Localized edema  Spironolactone 25 mg po Q Day  Check Met C 1 week  Pt refuses to wear TED Hose  Family/ staff Communication:   Total Time:  Documentation:  Face to Face:  Family/Phone:   Labs/tests ordered:  Met C   Medication list reviewed and assessed for continued appropriateness.  Vikki Ports, NP-C Geriatrics [facility] [phone], [place] [address] Cell Phone (Mon-Fri 8am-5pm):  [phone] On Call:  [phone] & follow prompts after 5pm & weekends Office Phone:  [phone] Office Fax:  [phone]

## 2016-07-05 DIAGNOSIS — I10 Essential (primary) hypertension: Secondary | ICD-10-CM | POA: Diagnosis not present

## 2016-07-05 LAB — GLUCOSE, CAPILLARY: Glucose-Capillary: 128 mg/dL — ABNORMAL HIGH (ref 65–99)

## 2016-07-06 DIAGNOSIS — I10 Essential (primary) hypertension: Secondary | ICD-10-CM | POA: Diagnosis not present

## 2016-07-06 LAB — GLUCOSE, CAPILLARY: GLUCOSE-CAPILLARY: 147 mg/dL — AB (ref 65–99)

## 2016-07-07 DIAGNOSIS — I10 Essential (primary) hypertension: Secondary | ICD-10-CM | POA: Diagnosis not present

## 2016-07-07 LAB — GLUCOSE, CAPILLARY: Glucose-Capillary: 115 mg/dL — ABNORMAL HIGH (ref 65–99)

## 2016-07-09 DIAGNOSIS — I10 Essential (primary) hypertension: Secondary | ICD-10-CM | POA: Diagnosis not present

## 2016-07-09 LAB — GLUCOSE, CAPILLARY: Glucose-Capillary: 122 mg/dL — ABNORMAL HIGH (ref 65–99)

## 2016-07-10 DIAGNOSIS — I10 Essential (primary) hypertension: Secondary | ICD-10-CM | POA: Diagnosis not present

## 2016-07-10 LAB — GLUCOSE, CAPILLARY: Glucose-Capillary: 132 mg/dL — ABNORMAL HIGH (ref 65–99)

## 2016-07-11 ENCOUNTER — Other Ambulatory Visit
Admission: RE | Admit: 2016-07-11 | Discharge: 2016-07-11 | Disposition: A | Discharge status: Home / Self Care | Payer: PPO | Source: Ambulatory Visit | Attending: Gerontology | Admitting: Gerontology

## 2016-07-11 DIAGNOSIS — I10 Essential (primary) hypertension: Secondary | ICD-10-CM | POA: Diagnosis not present

## 2016-07-11 LAB — GLUCOSE, CAPILLARY: Glucose-Capillary: 174 mg/dL — ABNORMAL HIGH (ref 65–99)

## 2016-07-11 LAB — COMPREHENSIVE METABOLIC PANEL
ALBUMIN: 3.8 g/dL (ref 3.5–5.0)
ALT: 34 U/L (ref 14–54)
AST: 23 U/L (ref 15–41)
Alkaline Phosphatase: 103 U/L (ref 38–126)
Anion gap: 6 (ref 5–15)
BUN: 29 mg/dL — ABNORMAL HIGH (ref 6–20)
CHLORIDE: 99 mmol/L — AB (ref 101–111)
CO2: 31 mmol/L (ref 22–32)
CREATININE: 0.76 mg/dL (ref 0.44–1.00)
Calcium: 9.2 mg/dL (ref 8.9–10.3)
GFR calc Af Amer: >60 mL/min (ref >60)
GLUCOSE: 95 mg/dL (ref 65–99)
POTASSIUM: 4.9 mmol/L (ref 3.5–5.1)
SODIUM: 136 mmol/L (ref 135–145)
Total Bilirubin: 1.1 mg/dL (ref 0.3–1.2)
Total Protein: 6.7 g/dL (ref 6.5–8.1)

## 2016-07-12 DIAGNOSIS — I10 Essential (primary) hypertension: Secondary | ICD-10-CM | POA: Diagnosis not present

## 2016-07-12 LAB — GLUCOSE, CAPILLARY: Glucose-Capillary: 123 mg/dL — ABNORMAL HIGH (ref 65–99)

## 2016-07-13 DIAGNOSIS — E119 Type 2 diabetes mellitus without complications: Secondary | ICD-10-CM | POA: Diagnosis not present

## 2016-07-13 DIAGNOSIS — F0390 Unspecified dementia without behavioral disturbance: Secondary | ICD-10-CM | POA: Diagnosis not present

## 2016-07-13 DIAGNOSIS — M810 Age-related osteoporosis without current pathological fracture: Secondary | ICD-10-CM | POA: Diagnosis not present

## 2016-07-13 DIAGNOSIS — I10 Essential (primary) hypertension: Secondary | ICD-10-CM | POA: Diagnosis not present

## 2016-07-14 ENCOUNTER — Encounter
Admission: RE | Admit: 2016-07-14 | Discharge: 2016-07-14 | Disposition: A | Discharge status: Home / Self Care | Payer: PPO | Source: Ambulatory Visit | Attending: Internal Medicine | Admitting: Internal Medicine

## 2016-08-13 ENCOUNTER — Encounter
Admission: RE | Admit: 2016-08-13 | Discharge: 2016-08-13 | Disposition: A | Discharge status: Home / Self Care | Payer: PPO | Source: Ambulatory Visit | Attending: Internal Medicine | Admitting: Internal Medicine

## 2016-09-13 ENCOUNTER — Encounter
Admission: RE | Admit: 2016-09-13 | Discharge: 2016-09-13 | Disposition: A | Discharge status: Home / Self Care | Payer: PPO | Source: Ambulatory Visit | Attending: Internal Medicine | Admitting: Internal Medicine

## 2016-09-19 ENCOUNTER — Non-Acute Institutional Stay: Payer: PPO | Admitting: Gerontology

## 2016-09-19 ENCOUNTER — Encounter: Payer: Self-pay | Admitting: Gerontology

## 2016-09-19 DIAGNOSIS — I447 Left bundle-branch block, unspecified: Secondary | ICD-10-CM | POA: Diagnosis not present

## 2016-09-19 DIAGNOSIS — E1139 Type 2 diabetes mellitus with other diabetic ophthalmic complication: Secondary | ICD-10-CM

## 2016-09-19 DIAGNOSIS — M810 Age-related osteoporosis without current pathological fracture: Secondary | ICD-10-CM | POA: Diagnosis not present

## 2016-09-19 DIAGNOSIS — I10 Essential (primary) hypertension: Secondary | ICD-10-CM

## 2016-09-19 DIAGNOSIS — H409 Unspecified glaucoma: Secondary | ICD-10-CM

## 2016-09-19 DIAGNOSIS — I5022 Chronic systolic (congestive) heart failure: Secondary | ICD-10-CM

## 2016-09-19 DIAGNOSIS — F0150 Vascular dementia without behavioral disturbance: Secondary | ICD-10-CM

## 2016-09-19 DIAGNOSIS — K5900 Constipation, unspecified: Secondary | ICD-10-CM

## 2016-09-19 DIAGNOSIS — F329 Major depressive disorder, single episode, unspecified: Secondary | ICD-10-CM

## 2016-09-19 NOTE — Progress Notes (Addendum)
Location:   [facility] Room Number: [phone] Place of Service:  ALF [phone]) Provider:  Toni Arthurs, NP-C  Crecencio Mc, MD  Patient Care Team: Crecencio Mc, MD as PCP - General (Internal Medicine)  Extended Emergency Contact Information Primary Emergency Contact: Pistilli,Richard Address: [address], [place] [id number] Johnnette Litter of [place] Phone: [phone] Relation: Son Secondary Emergency Contact: [place] of [place] Phone: [phone] Relation: Son  Code Status:  DNR Goals of care: Advanced Directive information Advanced Directives 09/19/2016  Does Patient Have a Medical Advance Directive? Yes  Type of Advance Directive Out of facility DNR (pink MOST or yellow form)  Does patient want to make changes to medical advance directive? No - Patient declined  Copy of [place] in Chart? -     Chief Complaint  Patient presents with  . Medical Management of Chronic Issues    Routine Visit    HPI:  Pt is a 83 y.o. female seen today for medical management of chronic diseases. Pt has been stable over this past month. No falls. Pt is continent of B&B. Needs minimal assistance with ADLs. Enjoys being with other residents. Appetite good. Pt says she doesn't sleep at night, but pt does not c/o fatigue. Staff reports she sleeps well. Regular BMs. Poor eye sight. Mobile on unit without use of assistive devices. VSS. No other complaints.     Past Medical History:  Diagnosis Date  . Diabetes mellitus    diet controlled  . Glaucoma   . Hypertension   . Vascular dementia    Past Surgical History:  Procedure Laterality Date  . CATARACT EXTRACTION      Allergies  Allergen Reactions  . Sulfa Antibiotics     Allergies as of 09/19/2016      Reactions   Sulfa Antibiotics       Medication List       Accurate as of 09/19/16 11:55 AM. Always use your most recent med list.          ACCU-CHEK  SOFTCLIX LANCETS lancets 1 each by Other route daily. Use as instructed   amLODipine 5 MG tablet Commonly known as:  NORVASC Take 5 mg by mouth daily.   aspirin EC 81 MG tablet Take 81 mg by mouth daily.   brinzolamide 1 % ophthalmic suspension Commonly known as:  AZOPT Place 1 drop into both eyes every 12 (twelve) hours.   BYSTOLIC 20 MG Tabs Generic drug:  Nebivolol HCl Take 1 tablet by mouth daily   glipiZIDE 5 MG tablet Commonly known as:  GLUCOTROL Take 1 tablet by mouth every morning before breakfast   glucose blood test strip Commonly known as:  ACCU-CHEK AVIVA Check blood sugar twice daily Dx 250.00   ICAPS PO Take 1 tablet by mouth 2 (two) times daily.   losartan 100 MG tablet Commonly known as:  COZAAR Take 1 tablet by mouth daily   polyethylene glycol packet Commonly known as:  MIRALAX / GLYCOLAX Take 17 g by mouth daily.   sertraline 50 MG tablet Commonly known as:  ZOLOFT Take 50 mg by mouth daily.   spironolactone 25 MG tablet Commonly known as:  ALDACTONE Take 25 mg by mouth daily.   traMADol 50 MG tablet Commonly known as:  ULTRAM Take 1 tablet (50 mg total) by mouth every 6 (six) hours as needed.   Vitamin D3 1000 units  Caps Take 1 capsule by mouth daily.       Review of Systems  Unable to perform ROS: Dementia  Constitutional: Negative for activity change, appetite change, chills, diaphoresis, fatigue and fever.  HENT: Negative for congestion, sneezing, sore throat, trouble swallowing and voice change.   Eyes: Negative.   Respiratory: Negative for apnea, cough, choking, chest tightness, shortness of breath and wheezing.   Cardiovascular: Negative for chest pain, palpitations and leg swelling.  Gastrointestinal: Negative for abdominal distention, abdominal pain, constipation, diarrhea and nausea.  Endocrine: Negative.   Genitourinary: Negative for difficulty urinating, dysuria, frequency and urgency.  Musculoskeletal: Negative for back  pain, gait problem and myalgias. Arthralgias: typical arthritis.  Skin: Negative for color change, pallor, rash and wound.  Allergic/Immunologic: Negative.   Neurological: Negative for dizziness, tremors, seizures, syncope, facial asymmetry, speech difficulty, weakness, numbness and headaches.  Hematological: Negative.   Psychiatric/Behavioral: Negative for agitation and behavioral problems.  All other systems reviewed and are negative.   Immunization History  Administered Date(s) Administered  . Influenza Split 11/30/2011, 12/05/2012, 11/13/2013  . Influenza-Unspecified 11/16/2014  . PPD Test 01/31/2016  . Pneumococcal Conjugate-13 04/23/2013  . Pneumococcal Polysaccharide-23 10/06/2011  . Tdap 09/15/2014  . Zoster 04/23/2005   Pertinent  Health Maintenance Due  Topic Date Due  . HEMOGLOBIN A1C  12/09/2015  . FOOT EXAM  03/07/2016  . INFLUENZA VACCINE  12/23/2016 (Originally 09/13/2016)  . OPHTHALMOLOGY EXAM  06/07/2017  . DEXA SCAN  Completed  . PNA vac Low Risk Adult  Completed   Fall Risk  02/16/2015 09/17/2014 11/08/2013 12/17/2012  Falls in the past year? No No No No   Functional Status Survey:    Vitals:   09/19/16 1121  BP: 130/73  Pulse: 76  Temp: (!) 97.4 F (36.3 C)  Weight: 113 lb 14.4 oz (51.7 kg)  Height: 5\' 5"  (1.651 m)   Body mass index is 18.95 kg/m. Physical Exam  Constitutional: She is oriented to person, place, and time. Vital signs are normal. She appears well-developed and well-nourished. She is active and cooperative. She does not appear ill. No distress.  HENT:  Head: Normocephalic and atraumatic.  Mouth/Throat: Uvula is midline, oropharynx is clear and moist and mucous membranes are normal. Mucous membranes are not pale, not dry and not cyanotic.  Eyes: Pupils are equal, round, and reactive to light. Conjunctivae, EOM and lids are normal.  Neck: Trachea normal, normal range of motion and full passive range of motion without pain. Neck supple. No JVD  present. No tracheal deviation, no edema and no erythema present. No thyromegaly present.  Cardiovascular: Normal rate, regular rhythm, normal heart sounds, intact distal pulses and normal pulses.  Exam reveals no gallop, no distant heart sounds and no friction rub.   No murmur heard. Pulses:      Dorsalis pedis pulses are 2+ on the right side, and 2+ on the left side.  2+ BLE edema   Pulmonary/Chest: Effort normal and breath sounds normal. No accessory muscle usage. No respiratory distress. She has no decreased breath sounds. She has no wheezes. She has no rhonchi. She has no rales. She exhibits no tenderness.  Abdominal: Soft. Normal appearance and bowel sounds are normal. She exhibits no distension and no ascites. There is no tenderness.  Musculoskeletal: Normal range of motion. She exhibits no edema or tenderness.  Expected osteoarthritis, stiffness  Neurological: She is alert and oriented to person, place, and time. She has normal strength.  Skin: Skin is warm, dry and  intact. No rash noted. She is not diaphoretic. No cyanosis or erythema. No pallor. Nails show no clubbing.  Psychiatric: She has a normal mood and affect. Her speech is normal and behavior is normal. Judgment and thought content normal. Cognition and memory are normal.  Nursing note and vitals reviewed.   Labs reviewed:  Recent Labs  02/10/16 1400 02/12/16 1245 07/11/16 0430  NA 137 138 136  K 3.6 3.9 4.9  CL 99* 100* 99*  CO2 27 27 31   GLUCOSE 108* 99 95  BUN 38* 27* 29*  CREATININE 0.68 0.74 0.76  CALCIUM 9.4 9.2 9.2  MG 2.2  --   --     Recent Labs  02/10/16 1400 02/12/16 1245 07/11/16 0430  AST 17 24 23   ALT 16 14 34  ALKPHOS 72 64 103  BILITOT 1.9* 1.8* 1.1  PROT 7.5 7.3 6.7  ALBUMIN 4.1 4.0 3.8    Recent Labs  02/10/16 1600 02/12/16 1245  WBC 11.9* 10.6  NEUTROABS 10.0*  --   HGB 13.4 13.8  HCT 39.5 41.1  MCV 86.8 87.5  PLT 286 281   Lab Results  Component Value Date   TSH 0.843  02/10/2016   Lab Results  Component Value Date   HGBA1C 7.3 (H) 06/09/2015   Lab Results  Component Value Date   CHOL 201 (H) 06/09/2015   HDL 81.90 06/09/2015   LDLCALC 103 (H) 06/09/2015   LDLDIRECT 97.0 06/09/2015   TRIG 81.0 06/09/2015   CHOLHDL 2 06/09/2015    Significant Diagnostic Results in last 30 days:  No results found.  Assessment/Plan 1. Essential hypertension  Stable  Continue Amlodipine 5 mg po Q Day  Continue ASA 81 mg po Q Day  Continue Bystolic 20 mg po Q Day  Continue Losartan 100 mg po Q day  2. Vascular dementia, uncomplicated  Stable  3. Major depressive disorder with single episode, remission status unspecified  Stable  Continue Sertraline 50 mg po Q Day  4. Glaucoma of both eyes, unspecified glaucoma type  Stable  Continue Azopt 1% 1 drop in both eyes Q 12 hours  5. Age-related osteoporosis without current pathological fracture  Stable  Continue Cholecalciferol 1,000 units po Q Day  6. 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 Constipation, unspecified constipation type  Stable  Continue Miralx 17 g PO Q day- mix with 4-8 ounces of fluid  8. Left bundle branch block  Stable  9. Chronic systolic congestive heart failure (HCC)  Stable  Family/ staff Communication:   Total Time:  Documentation:  Face to Face:  Family/Phone:   Labs/tests ordered:    Medication list reviewed and assessed for continued appropriateness.  Vikki Ports, NP-C [facility]s [facility] [phone], [place] [address] Cell Phone (Mon-Fri 8am-5pm):  [phone] On Call:  [phone] & follow prompts after 5pm & weekends Office Phone:  [phone] Office Fax:  [phone]

## 2016-10-14 ENCOUNTER — Encounter
Admission: RE | Admit: 2016-10-14 | Discharge: 2016-10-14 | Disposition: A | Discharge status: Home / Self Care | Payer: PPO | Source: Ambulatory Visit | Attending: Internal Medicine | Admitting: Internal Medicine

## 2016-10-20 ENCOUNTER — Non-Acute Institutional Stay: Payer: PPO | Admitting: Gerontology

## 2016-10-20 ENCOUNTER — Encounter: Payer: Self-pay | Admitting: Gerontology

## 2016-10-20 DIAGNOSIS — I5022 Chronic systolic (congestive) heart failure: Secondary | ICD-10-CM | POA: Diagnosis not present

## 2016-10-20 DIAGNOSIS — F329 Major depressive disorder, single episode, unspecified: Secondary | ICD-10-CM

## 2016-10-20 DIAGNOSIS — E1139 Type 2 diabetes mellitus with other diabetic ophthalmic complication: Secondary | ICD-10-CM

## 2016-10-20 DIAGNOSIS — I447 Left bundle-branch block, unspecified: Secondary | ICD-10-CM | POA: Diagnosis not present

## 2016-10-20 DIAGNOSIS — K5900 Constipation, unspecified: Secondary | ICD-10-CM | POA: Diagnosis not present

## 2016-10-20 DIAGNOSIS — I10 Essential (primary) hypertension: Secondary | ICD-10-CM | POA: Diagnosis not present

## 2016-10-20 DIAGNOSIS — H409 Unspecified glaucoma: Secondary | ICD-10-CM

## 2016-10-20 DIAGNOSIS — F0150 Vascular dementia without behavioral disturbance: Secondary | ICD-10-CM

## 2016-10-20 DIAGNOSIS — M810 Age-related osteoporosis without current pathological fracture: Secondary | ICD-10-CM

## 2016-10-20 NOTE — Progress Notes (Signed)
Location:   [facility] Room Number: [place] of Service:  ALF [phone]) Provider:  Toni Arthurs, NP-C  Crecencio Mc, MD  Patient Care Team: Crecencio Mc, MD as PCP - General (Internal Medicine)  Extended Emergency Contact Information Primary Emergency Contact: Sarrazin,Richard Address: [address], [place] [id number] Johnnette Litter of [place] Phone: [phone] Relation: Son Secondary Emergency Contact: [place] of [place] Phone: [phone] Relation: Son  Code Status:  DNR Goals of care: Advanced Directive information Advanced Directives 10/20/2016  Does Patient Have a Medical Advance Directive? Yes  Type of Advance Directive Out of facility DNR (pink MOST or yellow form)  Does patient want to make changes to medical advance directive? No - Patient declined  Copy of [place] in Chart? -     Chief Complaint  Patient presents with  . Medical Management of Chronic Issues    Routine Visit    HPI:  Pt is a 83 y.o. female seen today for medical management of chronic diseases. Pt has been stable over this past month. No falls. Pt is continent of B&B. Needs minimal assistance with ADLs. Enjoys being with other residents. Appetite good. Pt says she doesn't sleep at night, but pt does not c/o fatigue. Staff reports she sleeps well. Regular BMs. Poor eye sight. Mobile on unit without use of assistive devices. VSS. No other complaints.     Past Medical History:  Diagnosis Date  . Diabetes mellitus    diet controlled  . Glaucoma   . Hypertension   . Vascular dementia    Past Surgical History:  Procedure Laterality Date  . CATARACT EXTRACTION      Allergies  Allergen Reactions  . Sulfa Antibiotics     Allergies as of 10/20/2016      Reactions   Sulfa Antibiotics       Medication List       Accurate as of 10/20/16 11:41 AM. Always use your most recent med list.          ACCU-CHEK  SOFTCLIX LANCETS lancets 1 each by Other route daily. Use as instructed   amLODipine 5 MG tablet Commonly known as:  NORVASC Take 5 mg by mouth daily.   aspirin EC 81 MG tablet Take 81 mg by mouth daily.   brinzolamide 1 % ophthalmic suspension Commonly known as:  AZOPT Place 1 drop into both eyes every 12 (twelve) hours.   BYSTOLIC 20 MG Tabs Generic drug:  Nebivolol HCl Take 1 tablet by mouth daily   glipiZIDE 5 MG tablet Commonly known as:  GLUCOTROL Take 1 tablet by mouth every morning before breakfast   glucose blood test strip Commonly known as:  ACCU-CHEK AVIVA Check blood sugar twice daily Dx 250.00   ICAPS PO Take 1 tablet by mouth 2 (two) times daily.   losartan 100 MG tablet Commonly known as:  COZAAR Take 1 tablet by mouth daily   polyethylene glycol packet Commonly known as:  MIRALAX / GLYCOLAX Take 17 g by mouth daily.   sertraline 50 MG tablet Commonly known as:  ZOLOFT Take 50 mg by mouth daily.   spironolactone 25 MG tablet Commonly known as:  ALDACTONE Take 25 mg by mouth daily.   traMADol 50 MG tablet Commonly known as:  ULTRAM Take 1 tablet (50 mg total) by mouth every 6 (six) hours as needed.   Vitamin D3 1000 units  Caps Take 1 capsule by mouth daily.       Review of Systems  Unable to perform ROS: Dementia  Constitutional: Negative for activity change, appetite change, chills, diaphoresis, fatigue and fever.  HENT: Negative for congestion, sneezing, sore throat, trouble swallowing and voice change.   Eyes: Negative.   Respiratory: Negative for apnea, cough, choking, chest tightness, shortness of breath and wheezing.   Cardiovascular: Negative for chest pain, palpitations and leg swelling.  Gastrointestinal: Negative for abdominal distention, abdominal pain, constipation, diarrhea and nausea.  Endocrine: Negative.   Genitourinary: Negative for difficulty urinating, dysuria, frequency and urgency.  Musculoskeletal: Negative for back  pain, gait problem and myalgias. Arthralgias: typical arthritis.  Skin: Negative for color change, pallor, rash and wound.  Allergic/Immunologic: Negative.   Neurological: Negative for dizziness, tremors, seizures, syncope, facial asymmetry, speech difficulty, weakness, numbness and headaches.  Hematological: Negative.   Psychiatric/Behavioral: Negative for agitation and behavioral problems.  All other systems reviewed and are negative.   Immunization History  Administered Date(s) Administered  . Influenza Split 11/30/2011, 12/05/2012, 11/13/2013  . Influenza-Unspecified 11/16/2014  . PPD Test 01/31/2016  . Pneumococcal Conjugate-13 04/23/2013  . Pneumococcal Polysaccharide-23 10/06/2011  . Tdap 09/15/2014  . Zoster 04/23/2005   Pertinent  Health Maintenance Due  Topic Date Due  . HEMOGLOBIN A1C  12/09/2015  . FOOT EXAM  03/07/2016  . INFLUENZA VACCINE  12/23/2016 (Originally 09/13/2016)  . OPHTHALMOLOGY EXAM  06/07/2017  . DEXA SCAN  Completed  . PNA vac Low Risk Adult  Completed   Fall Risk  02/16/2015 09/17/2014 11/08/2013 12/17/2012  Falls in the past year? No No No No   Functional Status Survey:    Vitals:   10/20/16 1139  BP: 133/67  Pulse: 77  Temp: (!) 97.4 F (36.3 C)  Weight: 115 lb 1.6 oz (52.2 kg)  Height: _0  (1.651 m)   Body mass index is 19.15 kg/m. Physical Exam  Constitutional: She is oriented to person, place, and time. Vital signs are normal. She appears well-developed and well-nourished. She is active and cooperative. She does not appear ill. No distress.  HENT:  Head: Normocephalic and atraumatic.  Mouth/Throat: Uvula is midline, oropharynx is clear and moist and mucous membranes are normal. Mucous membranes are not pale, not dry and not cyanotic.  Eyes: Pupils are equal, round, and reactive to light. Conjunctivae, EOM and lids are normal.  Neck: Trachea normal, normal range of motion and full passive range of motion without pain. Neck supple. No JVD  present. No tracheal deviation, no edema and no erythema present. No thyromegaly present.  Cardiovascular: Normal rate, regular rhythm, normal heart sounds, intact distal pulses and normal pulses.  Exam reveals no gallop, no distant heart sounds and no friction rub.   No murmur heard. Pulses:      Dorsalis pedis pulses are 2+ on the right side, and 2+ on the left side.  2+ BLE edema   Pulmonary/Chest: Effort normal and breath sounds normal. No accessory muscle usage. No respiratory distress. She has no decreased breath sounds. She has no wheezes. She has no rhonchi. She has no rales. She exhibits no tenderness.  Abdominal: Soft. Normal appearance and bowel sounds are normal. She exhibits no distension and no ascites. There is no tenderness.  Musculoskeletal: Normal range of motion. She exhibits no edema or tenderness.  Expected osteoarthritis, stiffness  Neurological: She is alert and oriented to person, place, and time. She has normal strength.  Skin: Skin is warm, dry and  intact. No rash noted. She is not diaphoretic. No cyanosis or erythema. No pallor. Nails show no clubbing.  Psychiatric: She has a normal mood and affect. Her speech is normal and behavior is normal. Judgment and thought content normal. Cognition and memory are normal.  Nursing note and vitals reviewed.   Labs reviewed:  Recent Labs  02/10/16 1400 02/12/16 1245 07/11/16 0430  NA 137 138 136  K 3.6 3.9 4.9  CL 99* 100* 99*  CO2 _0 GLUCOSE 108* 99 95  BUN 38* 27* 29*  CREATININE 0.68 0.74 0.76  CALCIUM 9.4 9.2 9.2  MG 2.2  --   --     Recent Labs  02/10/16 1400 02/12/16 1245 07/11/16 0430  AST _1 ALT 16 14 34  ALKPHOS 72 64 103  BILITOT 1.9* 1.8* 1.1  PROT 7.5 7.3 6.7  ALBUMIN 4.1 4.0 3.8    Recent Labs  02/10/16 1600 02/12/16 1245  WBC 11.9* 10.6  NEUTROABS 10.0*  --   HGB 13.4 13.8  HCT 39.5 41.1  MCV 86.8 87.5  PLT 286 281   Lab Results  Component Value Date   TSH 0.843  02/10/2016   Lab Results  Component Value Date   HGBA1C 7.3 (H) 06/09/2015   Lab Results  Component Value Date   CHOL 201 (H) 06/09/2015   HDL 81.90 06/09/2015   LDLCALC 103 (H) 06/09/2015   LDLDIRECT 97.0 06/09/2015   TRIG 81.0 06/09/2015   CHOLHDL 2 06/09/2015    Significant Diagnostic Results in last 30 days:  No results found.  Assessment/Plan 1. Essential hypertension  Stable  Continue Amlodipine 5 mg po Q Day  Continue ASA 81 mg po Q Day  Continue Bystolic 20 mg po Q Day  Continue Losartan 100 mg po Q day  2. Vascular dementia, uncomplicated  Stable  3. Major depressive disorder with single episode, remission status unspecified  Stable  Continue Sertraline 50 mg po Q Day  4. Glaucoma of both eyes, unspecified glaucoma type  Stable  Continue Azopt 1% 1 drop in both eyes Q 12 hours  5. Age-related osteoporosis without current pathological fracture  Stable  Continue Cholecalciferol 1,000 units po Q Day  6. 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 Constipation, unspecified constipation type  Stable  Continue Miralax 17 g PO Q day- mix with 4-8 ounces of fluid  8. Left bundle branch block  Stable  9. Chronic systolic congestive heart failure (HCC)  Stable  Family/ staff Communication:   Total Time:  Documentation:  Face to Face:  Family/Phone:   Labs/tests ordered:  Cbc, met c, mag+, tsh, B12, D  Medication list reviewed and assessed for continued appropriateness.  Vikki Ports, NP-C Geriatrics [facility] [phone], [place] [address] Cell Phone (Mon-Fri 8am-5pm):  [phone] On Call:  [phone] & follow prompts after 5pm & weekends Office Phone:  [phone] Office Fax:  [phone]

## 2016-11-02 ENCOUNTER — Other Ambulatory Visit
Admission: RE | Admit: 2016-11-02 | Discharge: 2016-11-02 | Disposition: A | Discharge status: Home / Self Care | Payer: PPO | Source: Ambulatory Visit | Attending: Gerontology | Admitting: Gerontology

## 2016-11-02 DIAGNOSIS — E1139 Type 2 diabetes mellitus with other diabetic ophthalmic complication: Secondary | ICD-10-CM | POA: Insufficient documentation

## 2016-11-02 LAB — COMPREHENSIVE METABOLIC PANEL
ALBUMIN: 3.8 g/dL (ref 3.5–5.0)
ALK PHOS: 72 U/L (ref 38–126)
ALT: 36 U/L (ref 14–54)
ANION GAP: 6 (ref 5–15)
AST: 24 U/L (ref 15–41)
BUN: 34 mg/dL — ABNORMAL HIGH (ref 6–20)
CALCIUM: 9.1 mg/dL (ref 8.9–10.3)
CHLORIDE: 100 mmol/L — AB (ref 101–111)
CO2: 28 mmol/L (ref 22–32)
CREATININE: 1.03 mg/dL — AB (ref 0.44–1.00)
GFR calc non Af Amer: 45 mL/min — ABNORMAL LOW (ref >60)
GFR, EST AFRICAN AMERICAN: 53 mL/min — AB (ref >60)
GLUCOSE: 101 mg/dL — AB (ref 65–99)
Potassium: 4.5 mmol/L (ref 3.5–5.1)
Sodium: 134 mmol/L — ABNORMAL LOW (ref 135–145)
Total Bilirubin: 1 mg/dL (ref 0.3–1.2)
Total Protein: 6.4 g/dL — ABNORMAL LOW (ref 6.5–8.1)

## 2016-11-02 LAB — CBC WITH DIFFERENTIAL/PLATELET
BASOS ABS: 0.1 10*3/uL (ref 0–0.1)
BASOS PCT: 1 %
EOS ABS: 0.2 10*3/uL (ref 0–0.7)
EOS PCT: 3 %
HCT: 33.5 % — ABNORMAL LOW (ref 35.0–47.0)
HEMOGLOBIN: 11.6 g/dL — AB (ref 12.0–16.0)
Lymphocytes Relative: 22 %
Lymphs Abs: 1.8 10*3/uL (ref 1.0–3.6)
MCH: 29.8 pg (ref 26.0–34.0)
MCHC: 34.6 g/dL (ref 32.0–36.0)
MCV: 86.2 fL (ref 80.0–100.0)
Monocytes Absolute: 0.8 10*3/uL (ref 0.2–0.9)
Monocytes Relative: 9 %
NEUTROS PCT: 65 %
Neutro Abs: 5.4 10*3/uL (ref 1.4–6.5)
Platelets: 246 10*3/uL (ref 150–440)
RBC: 3.89 MIL/uL (ref 3.80–5.20)
RDW: 14.2 % (ref 11.5–14.5)
WBC: 8.3 10*3/uL (ref 3.6–11.0)

## 2016-11-02 LAB — VITAMIN B12: VITAMIN B 12: 251 pg/mL (ref 180–914)

## 2016-11-02 LAB — TSH: TSH: 1.909 u[IU]/mL (ref 0.350–4.500)

## 2016-11-02 LAB — MAGNESIUM: MAGNESIUM: 2.1 mg/dL (ref 1.7–2.4)

## 2016-11-03 LAB — VITAMIN D 25 HYDROXY (VIT D DEFICIENCY, FRACTURES): VIT D 25 HYDROXY: 24 ng/mL — AB (ref 30.0–100.0)

## 2016-11-13 ENCOUNTER — Encounter
Admission: RE | Admit: 2016-11-13 | Discharge: 2016-11-13 | Disposition: A | Discharge status: Home / Self Care | Payer: PPO | Source: Ambulatory Visit | Attending: Internal Medicine | Admitting: Internal Medicine

## 2016-11-20 ENCOUNTER — Encounter: Payer: Self-pay | Admitting: Gerontology

## 2016-11-20 ENCOUNTER — Non-Acute Institutional Stay: Payer: PPO | Admitting: Gerontology

## 2016-11-20 DIAGNOSIS — I447 Left bundle-branch block, unspecified: Secondary | ICD-10-CM | POA: Diagnosis not present

## 2016-11-20 DIAGNOSIS — H409 Unspecified glaucoma: Secondary | ICD-10-CM | POA: Diagnosis not present

## 2016-11-20 DIAGNOSIS — F0150 Vascular dementia without behavioral disturbance: Secondary | ICD-10-CM

## 2016-11-20 DIAGNOSIS — I10 Essential (primary) hypertension: Secondary | ICD-10-CM

## 2016-11-20 DIAGNOSIS — F329 Major depressive disorder, single episode, unspecified: Secondary | ICD-10-CM | POA: Diagnosis not present

## 2016-11-20 DIAGNOSIS — E1139 Type 2 diabetes mellitus with other diabetic ophthalmic complication: Secondary | ICD-10-CM

## 2016-11-20 DIAGNOSIS — E538 Deficiency of other specified B group vitamins: Secondary | ICD-10-CM | POA: Diagnosis not present

## 2016-11-20 DIAGNOSIS — E559 Vitamin D deficiency, unspecified: Secondary | ICD-10-CM

## 2016-11-20 DIAGNOSIS — M810 Age-related osteoporosis without current pathological fracture: Secondary | ICD-10-CM | POA: Diagnosis not present

## 2016-11-20 DIAGNOSIS — I5022 Chronic systolic (congestive) heart failure: Secondary | ICD-10-CM | POA: Diagnosis not present

## 2016-11-20 DIAGNOSIS — K5900 Constipation, unspecified: Secondary | ICD-10-CM | POA: Diagnosis not present

## 2016-11-20 NOTE — Progress Notes (Signed)
Location:   [facility] Room Number: [place] of Service:  ALF [phone]) Provider:  Toni Arthurs, NP-C  Crecencio Mc, MD  Patient Care Team: Crecencio Mc, MD as PCP - General (Internal Medicine)  Extended Emergency Contact Information Primary Emergency Contact: Kleiber,Richard Address: [address], [place] [id number] Johnnette Litter of [place] Phone: [phone] Relation: Son Secondary Emergency Contact: [place] of [place] Phone: [phone] Relation: Son  Code Status:  DNR Goals of care: Advanced Directive information Advanced Directives 11/20/2016  Does Patient Have a Medical Advance Directive? Yes  Type of Advance Directive Out of facility DNR (pink MOST or yellow form)  Does patient want to make changes to medical advance directive? No - Patient declined  Copy of [place] in Chart? -     Chief Complaint  Patient presents with  . Medical Management of Chronic Issues    Routine Visit    HPI:  Pt is a 83 y.o. female seen today for medical management of chronic diseases.  Patient has been stable over this past month.  No falls.  Patient is continent of bowel and bladder.  Needs minimal assistance with ADLs.  Enjoys being with other residents.  Appetite good.  Patient says she does not sleep at night, but patient does not complain of fatigue.  Staff reports she sleeps well.  Regular BMs.  Poor eyesight.  Mobile on unit without use of assistive devices.  Patient has dementia.  Unable to give complete ROS.  Vital signs stable.  No other complaints.  Past Medical History:  Diagnosis Date  . Diabetes mellitus    diet controlled  . Glaucoma   . Hypertension   . Vascular dementia    Past Surgical History:  Procedure Laterality Date  . CATARACT EXTRACTION      Allergies  Allergen Reactions  . Sulfa Antibiotics     Allergies as of 11/20/2016      Reactions   Sulfa Antibiotics         Medication List       Accurate as of 11/20/16 10:17 AM. Always use your most recent med list.          ACCU-CHEK SOFTCLIX LANCETS lancets 1 each by Other route daily. Use as instructed   amLODipine 5 MG tablet Commonly known as:  NORVASC Take 5 mg by mouth daily.   aspirin EC 81 MG tablet Take 81 mg by mouth daily.   brinzolamide 1 % ophthalmic suspension Commonly known as:  AZOPT Place 1 drop into both eyes every 12 (twelve) hours.   BYSTOLIC 20 MG Tabs Generic drug:  Nebivolol HCl Take 1 tablet by mouth daily   Cholecalciferol 2000 units Caps Take 6,000 Units by mouth daily. 3 caps   cyanocobalamin 1000 MCG tablet Take 1,000 mcg by mouth daily.   glipiZIDE 5 MG tablet Commonly known as:  GLUCOTROL Take 1 tablet by mouth every morning before breakfast   glucose blood test strip Commonly known as:  ACCU-CHEK AVIVA Check blood sugar twice daily Dx 250.00   ICAPS PO Take 1 tablet by mouth 2 (two) times daily.   losartan 100 MG tablet Commonly known as:  COZAAR Take 1 tablet by mouth daily   polyethylene glycol packet Commonly known as:  MIRALAX / GLYCOLAX Take 17 g by mouth daily.   sertraline 50 MG tablet Commonly known as:  ZOLOFT Take 50  mg by mouth daily.   spironolactone 25 MG tablet Commonly known as:  ALDACTONE Take 25 mg by mouth daily.   traMADol 50 MG tablet Commonly known as:  ULTRAM Take 1 tablet (50 mg total) by mouth every 6 (six) hours as needed.       Review of Systems  Unable to perform ROS: Dementia  Constitutional: Negative for activity change, appetite change, chills, diaphoresis, fatigue and fever.  HENT: Negative for congestion, sneezing, sore throat, trouble swallowing and voice change.   Eyes: Negative.   Respiratory: Negative for apnea, cough, choking, chest tightness, shortness of breath and wheezing.   Cardiovascular: Negative for chest pain, palpitations and leg swelling.  Gastrointestinal: Negative for abdominal  distention, abdominal pain, constipation, diarrhea and nausea.  Endocrine: Negative.   Genitourinary: Negative for difficulty urinating, dysuria, frequency and urgency.  Musculoskeletal: Negative for back pain, gait problem and myalgias. Arthralgias: typical arthritis.  Skin: Negative for color change, pallor, rash and wound.  Allergic/Immunologic: Negative.   Neurological: Negative for dizziness, tremors, seizures, syncope, facial asymmetry, speech difficulty, weakness, numbness and headaches.  Hematological: Negative.   Psychiatric/Behavioral: Negative for agitation and behavioral problems.  All other systems reviewed and are negative.   Immunization History  Administered Date(s) Administered  . Influenza Split 11/30/2011, 12/05/2012, 11/13/2013  . Influenza-Unspecified 11/16/2014  . PPD Test 01/31/2016  . Pneumococcal Conjugate-13 04/23/2013  . Pneumococcal Polysaccharide-23 10/06/2011  . Tdap 09/15/2014  . Zoster 04/23/2005   Pertinent  Health Maintenance Due  Topic Date Due  . HEMOGLOBIN A1C  12/09/2015  . FOOT EXAM  03/07/2016  . INFLUENZA VACCINE  12/23/2016 (Originally 09/13/2016)  . OPHTHALMOLOGY EXAM  06/07/2017  . DEXA SCAN  Completed  . PNA vac Low Risk Adult  Completed   Fall Risk  02/16/2015 09/17/2014 11/08/2013 12/17/2012  Falls in the past year? No No No No   Functional Status Survey:    Vitals:   11/20/16 1015  BP: (!) 145/70  Pulse: 75  Temp: 98.6 F (37 C)  Weight: 117 lb 9.6 oz (53.3 kg)  Height: 5\' 5"  (1.651 m)   Body mass index is 19.57 kg/m. Physical Exam  Constitutional: She is oriented to person, place, and time. Vital signs are normal. She appears well-developed and well-nourished. She is active and cooperative. She does not appear ill. No distress.  HENT:  Head: Normocephalic and atraumatic.  Mouth/Throat: Uvula is midline, oropharynx is clear and moist and mucous membranes are normal. Mucous membranes are not pale, not dry and not cyanotic.    Eyes: Pupils are equal, round, and reactive to light. Conjunctivae, EOM and lids are normal.  Neck: Trachea normal, normal range of motion and full passive range of motion without pain. Neck supple. No JVD present. No tracheal deviation, no edema and no erythema present. No thyromegaly present.  Cardiovascular: Normal rate, regular rhythm, normal heart sounds, intact distal pulses and normal pulses.  Exam reveals no gallop, no distant heart sounds and no friction rub.   No murmur heard. Pulses:      Dorsalis pedis pulses are 2+ on the right side, and 2+ on the left side.  1+ BLE edema  Pulmonary/Chest: Effort normal and breath sounds normal. No accessory muscle usage. No respiratory distress. She has no decreased breath sounds. She has no wheezes. She has no rhonchi. She has no rales. She exhibits no tenderness.  Abdominal: Soft. Normal appearance and bowel sounds are normal. She exhibits no distension and no ascites. There is no tenderness.  Musculoskeletal: Normal range of motion. She exhibits no edema or tenderness.  Expected osteoarthritis, stiffness  Neurological: She is alert and oriented to person, place, and time. She has normal strength.  Skin: Skin is warm, dry and intact. No rash noted. She is not diaphoretic. No cyanosis or erythema. No pallor. Nails show no clubbing.  Psychiatric: She has a normal mood and affect. Her speech is normal and behavior is normal. Judgment and thought content normal. Cognition and memory are normal.  Nursing note and vitals reviewed.   Labs reviewed:  Recent Labs  02/10/16 1400 02/12/16 1245 07/11/16 0430 11/02/16 0500  NA 137 138 136 134*  K 3.6 3.9 4.9 4.5  CL 99* 100* 99* 100*  CO2 27 27 31 28   GLUCOSE 108* 99 95 101*  BUN 38* 27* 29* 34*  CREATININE 0.68 0.74 0.76 1.03*  CALCIUM 9.4 9.2 9.2 9.1  MG 2.2  --   --  2.1    Recent Labs  02/12/16 1245 07/11/16 0430 11/02/16 0500  AST 24 23 24   ALT 14 34 36  ALKPHOS 64 103 72   BILITOT 1.8* 1.1 1.0  PROT 7.3 6.7 6.4*  ALBUMIN 4.0 3.8 3.8    Recent Labs  02/10/16 1600 02/12/16 1245 11/02/16 0500  WBC 11.9* 10.6 8.3  NEUTROABS 10.0*  --  5.4  HGB 13.4 13.8 11.6*  HCT 39.5 41.1 33.5*  MCV 86.8 87.5 86.2  PLT 286 281 246   Lab Results  Component Value Date   TSH 1.909 11/02/2016   Lab Results  Component Value Date   HGBA1C 7.3 (H) 06/09/2015   Lab Results  Component Value Date   CHOL 201 (H) 06/09/2015   HDL 81.90 06/09/2015   LDLCALC 103 (H) 06/09/2015   LDLDIRECT 97.0 06/09/2015   TRIG 81.0 06/09/2015   CHOLHDL 2 06/09/2015    Significant Diagnostic Results in last 30 days:  No results found.  Assessment/Plan 1. Essential hypertension  Stable  Continue Amlodipine 5 mg po Q Day  Continue ASA 81 mg po Q Day  Continue Bystolic 20 mg po Q Day  Continue Losartan 100 mg po Q day  2. Vascular dementia, uncomplicated  Stable  3. Major depressive disorder with single episode, remission status unspecified  Stable  Continue Sertraline 50 mg po Q Day  4. Glaucoma of both eyes, unspecified glaucoma type  Stable  Continue Azopt 1% 1 drop in both eyes Q 12 hours  5. Age-related osteoporosis without current pathological fracture  Stable seen  6. 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 Constipation, unspecified constipation type  Stable  Continue Miralax 17 g PO Q day- mix with 4-8 ounces of fluid  8. Left bundle branch block  Stable  9. Chronic systolic congestive heart failure (HCC)  Stable  10.  Vitamin B12 deficiency  Cyanocobalamin 1000 mcg tablets-2 tablets p.o. daily times 14 days, then  Cyanocobalamin 1000 mcg - 1 tablet p.o. Daily  11.  Vitamin D deficiency  Cholecalciferol 2000 units- 3 capsules p.o. daily times 12 weeks,  then  Cholecalciferol 2000 units-1 capsule p.o. daily  Continue all medications as listed above unless otherwise specified  Family/ staff Communication:   Total Time:  Documentation:  Face to Face:  Family/Phone:   Labs/tests ordered: Not due  Medication list  reviewed and assessed for continued appropriateness.  Vikki Ports, NP-C Geriatrics [facility] [phone], [place] [address] Cell Phone (Mon-Fri 8am-5pm):  [phone] On Call:  [phone] & follow prompts after 5pm & weekends Office Phone:  [phone] Office Fax:  [phone]

## 2016-12-04 DIAGNOSIS — E119 Type 2 diabetes mellitus without complications: Secondary | ICD-10-CM | POA: Diagnosis not present

## 2016-12-12 DIAGNOSIS — E559 Vitamin D deficiency, unspecified: Secondary | ICD-10-CM | POA: Insufficient documentation

## 2016-12-12 DIAGNOSIS — E538 Deficiency of other specified B group vitamins: Secondary | ICD-10-CM | POA: Insufficient documentation

## 2016-12-14 ENCOUNTER — Encounter
Admission: RE | Admit: 2016-12-14 | Discharge: 2016-12-14 | Disposition: A | Discharge status: Home / Self Care | Payer: PPO | Source: Ambulatory Visit | Attending: Internal Medicine | Admitting: Internal Medicine

## 2016-12-20 ENCOUNTER — Non-Acute Institutional Stay: Payer: PPO | Admitting: Gerontology

## 2016-12-20 ENCOUNTER — Encounter: Payer: Self-pay | Admitting: Gerontology

## 2016-12-20 DIAGNOSIS — I447 Left bundle-branch block, unspecified: Secondary | ICD-10-CM

## 2016-12-20 DIAGNOSIS — K5900 Constipation, unspecified: Secondary | ICD-10-CM

## 2016-12-20 DIAGNOSIS — F329 Major depressive disorder, single episode, unspecified: Secondary | ICD-10-CM | POA: Diagnosis not present

## 2016-12-20 DIAGNOSIS — E538 Deficiency of other specified B group vitamins: Secondary | ICD-10-CM

## 2016-12-20 DIAGNOSIS — H409 Unspecified glaucoma: Secondary | ICD-10-CM

## 2016-12-20 DIAGNOSIS — M810 Age-related osteoporosis without current pathological fracture: Secondary | ICD-10-CM

## 2016-12-20 DIAGNOSIS — F0150 Vascular dementia without behavioral disturbance: Secondary | ICD-10-CM

## 2016-12-20 DIAGNOSIS — E559 Vitamin D deficiency, unspecified: Secondary | ICD-10-CM | POA: Diagnosis not present

## 2016-12-20 DIAGNOSIS — I5022 Chronic systolic (congestive) heart failure: Secondary | ICD-10-CM

## 2016-12-20 DIAGNOSIS — E1139 Type 2 diabetes mellitus with other diabetic ophthalmic complication: Secondary | ICD-10-CM

## 2016-12-20 DIAGNOSIS — I10 Essential (primary) hypertension: Secondary | ICD-10-CM

## 2016-12-27 DIAGNOSIS — R0989 Other specified symptoms and signs involving the circulatory and respiratory systems: Secondary | ICD-10-CM | POA: Diagnosis not present

## 2016-12-27 DIAGNOSIS — R05 Cough: Secondary | ICD-10-CM | POA: Diagnosis not present

## 2016-12-28 ENCOUNTER — Encounter: Payer: Self-pay | Admitting: Gerontology

## 2016-12-28 ENCOUNTER — Non-Acute Institutional Stay: Payer: PPO | Admitting: Gerontology

## 2016-12-28 DIAGNOSIS — J189 Pneumonia, unspecified organism: Secondary | ICD-10-CM

## 2016-12-28 DIAGNOSIS — J181 Lobar pneumonia, unspecified organism: Secondary | ICD-10-CM | POA: Diagnosis not present

## 2016-12-28 NOTE — Progress Notes (Signed)
Location:    [facility] Room Number: [phone] Place of Service:  ALF [phone]) Provider:  Toni Arthurs, NP-C  Frazier Richards, MD  Patient Care Team: Frazier Richards, MD as PCP - General (Internal Medicine)  Extended Emergency Contact Information Primary Emergency Contact: Mwangi,Richard Address: [address], [place] [id number] Johnnette Litter of [place] Phone: [phone] Relation: Son Secondary Emergency Contact: [place] of [place] Phone: [phone] Relation: Son  Code Status:  DNR Goals of care: Advanced Directive information Advanced Directives 12/28/2016  Does Patient Have a Medical Advance Directive? Yes  Type of Advance Directive Out of facility DNR (pink MOST or yellow form);Healthcare Power of Attorney  Does patient want to make changes to medical advance directive? No - Patient declined  Copy of [place] in Chart? Yes     Chief Complaint  Patient presents with  . Acute Visit    Recheck patient's xray confirmed pneumonia    HPI:  Pt is a 83 y.o. female seen today for an acute visit for Pneumonia. Res c/o sore throat, raspy voice with low-grade temp of 99.6 over the weekend. Pt verbalized, "I just don't feel good." Nursing assessment over the weekend says diminished breath sounds throughout, except wheezing in LUL. Verbal order given for CXR with TID scheduled Duonebs and Guaifenesin. Today, pt reports she is feeling somewhat better. Pt denies n/v/d/f/c/cp/sob/ha/abd pain/dizziness/pain. She does report some congestion and non-productive cough. VSS, No other complaints.    Past Medical History:  Diagnosis Date  . Diabetes mellitus    diet controlled  . Glaucoma   . Hypertension   . Vascular dementia    Past Surgical History:  Procedure Laterality Date  . CATARACT EXTRACTION      Allergies  Allergen Reactions  . Sulfa Antibiotics     Allergies as of 12/28/2016    Reactions   Sulfa Antibiotics       Medication List        Accurate as of 12/28/16 11:38 AM. Always use your most recent med list.          ACCU-CHEK SOFTCLIX LANCETS lancets 1 each by Other route daily. Use as instructed   amLODipine 5 MG tablet Commonly known as:  NORVASC Take 5 mg by mouth daily.   aspirin EC 81 MG tablet Take 81 mg by mouth daily.   brinzolamide 1 % ophthalmic suspension Commonly known as:  AZOPT Place 1 drop into both eyes every 12 (twelve) hours.   BYSTOLIC 20 MG Tabs Generic drug:  Nebivolol HCl Take 1 tablet by mouth daily   Cholecalciferol 2000 units Caps Take 6,000 Units by mouth daily. 3 caps   cyanocobalamin 1000 MCG tablet Take 1,000 mcg by mouth daily.   glipiZIDE 5 MG tablet Commonly known as:  GLUCOTROL Take 1 tablet by mouth every morning before breakfast   glucose blood test strip Commonly known as:  ACCU-CHEK AVIVA Check blood sugar twice daily Dx 250.00   guaiFENesin 100 MG/5ML liquid Commonly known as:  ROBITUSSIN Take 200 mg every 4 (four) hours as needed by mouth for cough. Notify MD if symptoms continue for more than 48 hours.   ICAPS PO Take 1 tablet by mouth 2 (two) times daily.   ipratropium-albuterol 0.5-2.5 (3) MG/3ML Soln Commonly known as:  DUONEB Take 3 mLs 3 (three) times daily by nebulization.   losartan 100 MG tablet Commonly known as:  COZAAR Take  1 tablet by mouth daily   polyethylene glycol packet Commonly known as:  MIRALAX / GLYCOLAX Take 17 g by mouth daily.   sertraline 50 MG tablet Commonly known as:  ZOLOFT Take 50 mg by mouth daily.   spironolactone 25 MG tablet Commonly known as:  ALDACTONE Take 25 mg by mouth daily.   traMADol 50 MG tablet Commonly known as:  ULTRAM Take 1 tablet (50 mg total) by mouth every 6 (six) hours as needed.       Review of Systems  Constitutional: Positive for fatigue and fever (low grade). Negative for activity change, appetite change, chills and  diaphoresis.  HENT: Positive for congestion. Negative for ear pain, facial swelling, mouth sores, nosebleeds, postnasal drip, rhinorrhea, sinus pressure, sinus pain, sneezing, sore throat, trouble swallowing and voice change.   Respiratory: Positive for cough. Negative for apnea, choking, chest tightness, shortness of breath and wheezing.   Cardiovascular: Negative for chest pain, palpitations and leg swelling.  Gastrointestinal: Negative for abdominal distention, abdominal pain, constipation, diarrhea and nausea.  Genitourinary: Negative for difficulty urinating, dysuria, frequency and urgency.  Musculoskeletal: Negative for back pain, gait problem and myalgias. Arthralgias: typical arthritis.  Skin: Negative for color change, pallor, rash and wound.  Neurological: Negative for dizziness, tremors, syncope, speech difficulty, weakness, numbness and headaches.  Psychiatric/Behavioral: Negative for agitation and behavioral problems.  All other systems reviewed and are negative.   Immunization History  Administered Date(s) Administered  . Influenza Split 11/30/2011, 12/05/2012, 11/13/2013  . Influenza-Unspecified 11/16/2014  . PPD Test 01/31/2016  . Pneumococcal Conjugate-13 04/23/2013  . Pneumococcal Polysaccharide-23 10/06/2011  . Tdap 09/15/2014  . Zoster 04/23/2005   Pertinent  Health Maintenance Due  Topic Date Due  . HEMOGLOBIN A1C  12/09/2015  . FOOT EXAM  03/07/2016  . INFLUENZA VACCINE  09/13/2016  . OPHTHALMOLOGY EXAM  06/07/2017  . DEXA SCAN  Completed  . PNA vac Low Risk Adult  Completed   Fall Risk  02/16/2015 09/17/2014 11/08/2013 12/17/2012  Falls in the past year? No No No No   Functional Status Survey:    Vitals:   12/28/16 1106  BP: 140/81  Pulse: 88  Resp: 16  Temp: 97.8 F (36.6 C)  TempSrc: Oral  SpO2: 96%  Weight: 97 lb 3.2 oz (44.1 kg)  Height: 5\' 5"  (1.651 m)   Body mass index is 16.17 kg/m. Physical Exam  Constitutional: She is oriented to person,  place, and time. Vital signs are normal. She appears well-developed and well-nourished. She is active and cooperative. She does not appear ill. No distress.  HENT:  Head: Normocephalic and atraumatic.  Mouth/Throat: Uvula is midline, oropharynx is clear and moist and mucous membranes are normal. Mucous membranes are not pale, not dry and not cyanotic.  Eyes: Conjunctivae, EOM and lids are normal. Pupils are equal, round, and reactive to light.  Neck: Trachea normal, normal range of motion and full passive range of motion without pain. Neck supple. No JVD present. No tracheal deviation, no edema and no erythema present. No thyromegaly present.  Cardiovascular: Normal rate, normal heart sounds, intact distal pulses and normal pulses. An irregular rhythm present. Exam reveals no gallop, no distant heart sounds and no friction rub.  No murmur heard. Pulses:      Dorsalis pedis pulses are 2+ on the right side, and 2+ on the left side.  No edema  Pulmonary/Chest: Effort normal. No accessory muscle usage. No respiratory distress. She has decreased breath sounds in the right upper  field, the right middle field, the right lower field, the left upper field, the left middle field and the left lower field. She has no wheezes. She has no rhonchi. She has no rales. She exhibits no tenderness.  Abdominal: Soft. Normal appearance and bowel sounds are normal. She exhibits no distension and no ascites. There is no tenderness.  Musculoskeletal: Normal range of motion. She exhibits no edema or tenderness.  Expected osteoarthritis, stiffness; Bilateral Calves soft, supple. Negative Homan's Sign. B- pedal pulses equal  Neurological: She is alert and oriented to person, place, and time. She has normal strength.  Skin: Skin is warm, dry and intact. She is not diaphoretic. No cyanosis. No pallor. Nails show no clubbing.  Psychiatric: She has a normal mood and affect. Her speech is normal and behavior is normal. Judgment and  thought content normal. Cognition and memory are normal.  Nursing note and vitals reviewed.   Labs reviewed: Recent Labs    02/10/16 1400 02/12/16 1245 07/11/16 0430 11/02/16 0500  NA 137 138 136 134*  K 3.6 3.9 4.9 4.5  CL 99* 100* 99* 100*  CO2 27 27 31 28   GLUCOSE 108* 99 95 101*  BUN 38* 27* 29* 34*  CREATININE 0.68 0.74 0.76 1.03*  CALCIUM 9.4 9.2 9.2 9.1  MG 2.2  --   --  2.1   Recent Labs    02/12/16 1245 07/11/16 0430 11/02/16 0500  AST 24 23 24   ALT 14 34 36  ALKPHOS 64 103 72  BILITOT 1.8* 1.1 1.0  PROT 7.3 6.7 6.4*  ALBUMIN 4.0 3.8 3.8   Recent Labs    02/10/16 1600 02/12/16 1245 11/02/16 0500  WBC 11.9* 10.6 8.3  NEUTROABS 10.0*  --  5.4  HGB 13.4 13.8 11.6*  HCT 39.5 41.1 33.5*  MCV 86.8 87.5 86.2  PLT 286 281 246   Lab Results  Component Value Date   TSH 1.909 11/02/2016   Lab Results  Component Value Date   HGBA1C 7.3 (H) 06/09/2015   Lab Results  Component Value Date   CHOL 201 (H) 06/09/2015   HDL 81.90 06/09/2015   LDLCALC 103 (H) 06/09/2015   LDLDIRECT 97.0 06/09/2015   TRIG 81.0 06/09/2015   CHOLHDL 2 06/09/2015    Significant Diagnostic Results in last 30 days:  No results found.  Assessment/Plan 1. Pneumonia of both lower lobes due to infectious organism  Rocephin 1 G IM x 1 now, reconstitute with Lidocaine  Augmentin 875-125 mg 1 tablet po Q 12 hours x 7 days- start in the AM  Guaifenesin 10 mL po QID x 3 days scheduled  Duonebs TID scheduled x 3 days  Encourage po fluid intake   Family/ staff Communication:   Total Time:  Documentation:  Face to Face:  Family/Phone:   Labs/tests ordered:  2 view cxr  Medication list reviewed and assessed for continued appropriateness.  Vikki Ports, NP-C Geriatrics [facility] [phone] [place], [place] [address] Cell Phone (Mon-Fri 8am-5pm):  [phone] On Call:  [phone] & follow prompts after 5pm &  weekends Office Phone:  [phone] Office Fax:  [phone]

## 2017-01-13 ENCOUNTER — Encounter
Admission: RE | Admit: 2017-01-13 | Discharge: 2017-01-13 | Disposition: A | Discharge status: Home / Self Care | Payer: PPO | Source: Ambulatory Visit | Attending: Internal Medicine | Admitting: Internal Medicine

## 2017-01-23 DIAGNOSIS — Z0000 Encounter for general adult medical examination without abnormal findings: Secondary | ICD-10-CM | POA: Diagnosis not present

## 2017-01-23 DIAGNOSIS — M810 Age-related osteoporosis without current pathological fracture: Secondary | ICD-10-CM | POA: Diagnosis not present

## 2017-01-23 DIAGNOSIS — I10 Essential (primary) hypertension: Secondary | ICD-10-CM | POA: Diagnosis not present

## 2017-01-23 DIAGNOSIS — E119 Type 2 diabetes mellitus without complications: Secondary | ICD-10-CM | POA: Diagnosis not present

## 2017-01-23 DIAGNOSIS — F0390 Unspecified dementia without behavioral disturbance: Secondary | ICD-10-CM | POA: Diagnosis not present

## 2017-01-26 ENCOUNTER — Encounter: Payer: Self-pay | Admitting: Gerontology

## 2017-01-26 ENCOUNTER — Non-Acute Institutional Stay: Payer: PPO | Admitting: Gerontology

## 2017-01-26 DIAGNOSIS — F329 Major depressive disorder, single episode, unspecified: Secondary | ICD-10-CM | POA: Diagnosis not present

## 2017-01-26 DIAGNOSIS — I10 Essential (primary) hypertension: Secondary | ICD-10-CM | POA: Diagnosis not present

## 2017-01-26 DIAGNOSIS — E559 Vitamin D deficiency, unspecified: Secondary | ICD-10-CM | POA: Diagnosis not present

## 2017-01-26 DIAGNOSIS — J431 Panlobular emphysema: Secondary | ICD-10-CM

## 2017-01-26 DIAGNOSIS — E538 Deficiency of other specified B group vitamins: Secondary | ICD-10-CM | POA: Diagnosis not present

## 2017-01-26 DIAGNOSIS — H409 Unspecified glaucoma: Secondary | ICD-10-CM

## 2017-01-26 DIAGNOSIS — I447 Left bundle-branch block, unspecified: Secondary | ICD-10-CM | POA: Diagnosis not present

## 2017-01-26 DIAGNOSIS — I5022 Chronic systolic (congestive) heart failure: Secondary | ICD-10-CM

## 2017-01-26 DIAGNOSIS — M810 Age-related osteoporosis without current pathological fracture: Secondary | ICD-10-CM | POA: Diagnosis not present

## 2017-01-26 DIAGNOSIS — E1139 Type 2 diabetes mellitus with other diabetic ophthalmic complication: Secondary | ICD-10-CM | POA: Diagnosis not present

## 2017-01-26 DIAGNOSIS — F0150 Vascular dementia without behavioral disturbance: Secondary | ICD-10-CM | POA: Diagnosis not present

## 2017-01-26 DIAGNOSIS — K5900 Constipation, unspecified: Secondary | ICD-10-CM | POA: Diagnosis not present

## 2017-02-07 DIAGNOSIS — R05 Cough: Secondary | ICD-10-CM | POA: Diagnosis not present

## 2017-02-11 DIAGNOSIS — J439 Emphysema, unspecified: Secondary | ICD-10-CM | POA: Insufficient documentation

## 2017-02-11 DIAGNOSIS — J449 Chronic obstructive pulmonary disease, unspecified: Secondary | ICD-10-CM | POA: Diagnosis not present

## 2017-02-11 DIAGNOSIS — M6281 Muscle weakness (generalized): Secondary | ICD-10-CM | POA: Diagnosis not present

## 2017-02-11 DIAGNOSIS — R262 Difficulty in walking, not elsewhere classified: Secondary | ICD-10-CM | POA: Diagnosis not present

## 2017-02-11 DIAGNOSIS — I10 Essential (primary) hypertension: Secondary | ICD-10-CM | POA: Diagnosis not present

## 2017-02-11 NOTE — Progress Notes (Signed)
Location:   [facility] Room Number: [id number] Place of Service:  ALF [phone]) Provider:  Toni Arthurs, NP-C  Crecencio Mc, MD  Patient Care Team: Crecencio Mc, MD as PCP - General (Internal Medicine)  Extended Emergency Contact Information Primary Emergency Contact: Voshell,Richard Address: [address], [place] [id number] Johnnette Litter of [place] Phone: [phone] Relation: Son Secondary Emergency Contact: [place] of [place] Phone: [phone] Relation: Son  Code Status:  DNR Goals of care: Advanced Directive information Advanced Directives 12/28/2016  Does Patient Have a Medical Advance Directive? Yes  Type of Advance Directive Out of facility DNR (pink MOST or yellow form);Healthcare Power of Attorney  Does patient want to make changes to medical advance directive? No - Patient declined  Copy of [place] in Chart? Yes     Chief Complaint  Patient presents with  . Medical Management of Chronic Issues    Routine Visit    HPI:  Pt is a 83 y.o. female seen today for medical management of chronic diseases.  Pt has been stable over the past month. No falls. Pt is continent of B&B. Needs minimal assistance with ADLs. Enjoys being with other residents. Appetite is good. Typically eats 75-100% of all meals. Staff reports she is sleeping well at night. Regular BMs. Poor eyesight. Mobile on unit without use of assistive devices. Pt has dementia. Unable to give complete ROS. However, pt does c/o of dry cough. CXR f/u from PNA shows COPD/ emphysema. Pt denies chest pain or SOB. VSS. No other complaints.     Past Medical History:  Diagnosis Date  . Diabetes mellitus    diet controlled  . Glaucoma   . Hypertension   . Vascular dementia    Past Surgical History:  Procedure Laterality Date  . CATARACT EXTRACTION      Allergies  Allergen Reactions  . Sulfa Antibiotics     Allergies as of  01/26/2017      Reactions   Sulfa Antibiotics       Medication List        Accurate as of 01/26/17 11:59 PM. Always use your most recent med list.          ACCU-CHEK SOFTCLIX LANCETS lancets 1 each by Other route daily. Use as instructed   amLODipine 5 MG tablet Commonly known as:  NORVASC Take 5 mg by mouth daily.   aspirin EC 81 MG tablet Take 81 mg by mouth daily.   brinzolamide 1 % ophthalmic suspension Commonly known as:  AZOPT Place 1 drop into both eyes every 12 (twelve) hours.   BYSTOLIC 20 MG Tabs Generic drug:  Nebivolol HCl Take 1 tablet by mouth daily   cyanocobalamin 1000 MCG tablet Take 1,000 mcg by mouth daily.   glipiZIDE 5 MG tablet Commonly known as:  GLUCOTROL Take 1 tablet by mouth every morning before breakfast   glucose blood test strip Commonly known as:  ACCU-CHEK AVIVA Check blood sugar twice daily Dx 250.00   ICAPS PO Take 1 tablet by mouth 2 (two) times daily.   losartan 100 MG tablet Commonly known as:  COZAAR Take 1 tablet by mouth daily   polyethylene glycol packet Commonly known as:  MIRALAX / GLYCOLAX Take 17 g by mouth daily.   sertraline 50 MG tablet Commonly known as:  ZOLOFT Take 50 mg by mouth daily.   spironolactone 25 MG tablet Commonly  known as:  ALDACTONE Take 25 mg by mouth daily.   traMADol 50 MG tablet Commonly known as:  ULTRAM Take 1 tablet (50 mg total) by mouth every 6 (six) hours as needed.       Review of Systems  Unable to perform ROS: Dementia  Constitutional: Negative for activity change, appetite change, chills, diaphoresis, fatigue and fever.  HENT: Negative for congestion, mouth sores, nosebleeds, postnasal drip, sneezing, sore throat, trouble swallowing and voice change.   Eyes: Negative.   Respiratory: Negative for apnea, cough, choking, chest tightness, shortness of breath and wheezing.   Cardiovascular: Negative for chest pain, palpitations and leg swelling.  Gastrointestinal:  Negative for abdominal distention, abdominal pain, constipation, diarrhea and nausea.  Endocrine: Negative.   Genitourinary: Negative for difficulty urinating, dysuria, frequency and urgency.  Musculoskeletal: Negative for back pain, gait problem and myalgias. Arthralgias: typical arthritis.  Skin: Negative for color change, pallor, rash and wound.  Allergic/Immunologic: Negative.   Neurological: Negative for dizziness, tremors, seizures, syncope, facial asymmetry, speech difficulty, weakness, numbness and headaches.  Hematological: Negative.   Psychiatric/Behavioral: Negative for agitation and behavioral problems.  All other systems reviewed and are negative.   Immunization History  Administered Date(s) Administered  . Influenza Split 11/30/2011, 12/05/2012, 11/13/2013  . Influenza-Unspecified 11/16/2014, 11/07/2016  . PPD Test 01/31/2016  . Pneumococcal Conjugate-13 04/23/2013  . Pneumococcal Polysaccharide-23 10/06/2011  . Tdap 09/15/2014  . Zoster 04/23/2005   Pertinent  Health Maintenance Due  Topic Date Due  . HEMOGLOBIN A1C  12/09/2015  . FOOT EXAM  03/07/2016  . OPHTHALMOLOGY EXAM  06/07/2017  . INFLUENZA VACCINE  Completed  . DEXA SCAN  Completed  . PNA vac Low Risk Adult  Completed   Fall Risk  02/16/2015 09/17/2014 11/08/2013 12/17/2012  Falls in the past year? No No No No   Functional Status Survey:    Vitals:   01/26/17 1141  BP: (!) 173/76  Pulse: 87  Resp: 18  Temp: 98.8 F (37.1 C)  TempSrc: Oral  SpO2: 98%  Weight: 106 lb 14.4 oz (48.5 kg)  Height: 5\' 5"  (1.651 m)   Body mass index is 17.79 kg/m. Physical Exam  Constitutional: She is oriented to person, place, and time. Vital signs are normal. She appears well-developed and well-nourished. She is active and cooperative. She does not appear ill. No distress.  HENT:  Head: Normocephalic and atraumatic.  Mouth/Throat: Uvula is midline, oropharynx is clear and moist and mucous membranes are normal. Mucous  membranes are not pale, not dry and not cyanotic.  Eyes: Conjunctivae, EOM and lids are normal. Pupils are equal, round, and reactive to light.  Neck: Trachea normal, normal range of motion and full passive range of motion without pain. Neck supple. No JVD present. No tracheal deviation, no edema and no erythema present. No thyromegaly present.  Cardiovascular: Normal rate, normal heart sounds, intact distal pulses and normal pulses. An irregular rhythm present. Exam reveals no gallop, no distant heart sounds and no friction rub.  No murmur heard. Pulses:      Dorsalis pedis pulses are 2+ on the right side, and 2+ on the left side.  No edema  Pulmonary/Chest: Effort normal. No accessory muscle usage. No respiratory distress. She has decreased breath sounds in the right lower field and the left lower field. She has no wheezes. She has no rhonchi. She has no rales. She exhibits no tenderness.  Abdominal: Soft. Normal appearance and bowel sounds are normal. She exhibits no distension and no  ascites. There is no tenderness.  Musculoskeletal: Normal range of motion. She exhibits no edema or tenderness.  Expected osteoarthritis, stiffness  Neurological: She is alert and oriented to person, place, and time. She has normal strength.  Skin: Skin is warm, dry and intact. No rash noted. She is not diaphoretic. No cyanosis or erythema. No pallor. Nails show no clubbing.  Psychiatric: She has a normal mood and affect. Her speech is normal and behavior is normal. Judgment and thought content normal. Cognition and memory are normal.  Nursing note and vitals reviewed.   Labs reviewed: Recent Labs    07/11/16 0430 11/02/16 0500  NA 136 134*  K 4.9 4.5  CL 99* 100*  CO2 31 28  GLUCOSE 95 101*  BUN 29* 34*  CREATININE 0.76 1.03*  CALCIUM 9.2 9.1  MG  --  2.1   Recent Labs    07/11/16 0430 11/02/16 0500  AST 23 24  ALT 34 36  ALKPHOS 103 72  BILITOT 1.1 1.0  PROT 6.7 6.4*  ALBUMIN 3.8 3.8    Recent Labs    11/02/16 0500  WBC 8.3  NEUTROABS 5.4  HGB 11.6*  HCT 33.5*  MCV 86.2  PLT 246   Lab Results  Component Value Date   TSH 1.909 11/02/2016   Lab Results  Component Value Date   HGBA1C 7.3 (H) 06/09/2015   Lab Results  Component Value Date   CHOL 201 (H) 06/09/2015   HDL 81.90 06/09/2015   LDLCALC 103 (H) 06/09/2015   LDLDIRECT 97.0 06/09/2015   TRIG 81.0 06/09/2015   CHOLHDL 2 06/09/2015    Significant Diagnostic Results in last 30 days:  No results found.  Assessment/Plan 1. Essential hypertension  Stable  Continue Amlodipine 5 mg po Q Day  Continue ASA 81 mg po Q Day  Continue Bystolic 20 mg po Q Day  Continue Losartan 100 mg po Q day  2. Vascular dementia, uncomplicated  Stable  3. Major depressive disorder with single episode, remission status unspecified  Stable  Continue Sertraline 50 mg po Q Day  4. Glaucoma of both eyes, unspecified glaucoma type  Stable  Continue Azopt 1% 1 drop in both eyes Q 12 hours  5. Age-related osteoporosis without current pathological fracture  Stable   6. 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 Constipation, unspecified constipation type  Stable  Continue Miralax 17 g PO Q day- mix with 4-8 ounces of fluid  8. Left bundle branch block  Stable  9. Chronic systolic congestive heart failure (HCC)  Stable  10.  Vitamin B12 deficiency  Stable   Continue Cyanocobalamin 1000 mcg - 1 tablet p.o. Daily  11.  Vitamin D deficiency  Cholecalciferol 2000 units- 3 capsules p.o. daily times 12 weeks, then  Cholecalciferol 2000 units-1 capsule p.o. Daily  12. Panlobular emphysema  Breo Ellipta 100-25 mg 1 inhalation Q day. Rinse mouth well after use  Continue all  medications as listed above (01/26/17)  Family/ staff Communication:   Total Time:  Documentation:  Face to Face:  Family/Phone:   Labs/tests ordered: Not due  Medication list reviewed and assessed for continued appropriateness.  Vikki Ports, NP-C Geriatrics [facility] [phone],  [place] [address] Cell Phone (Mon-Fri 8am-5pm):  [phone] On Call:  [phone] & follow prompts after 5pm & weekends Office Phone:  [phone] Office Fax:  [phone]

## 2017-02-11 NOTE — Progress Notes (Signed)
Location:   [facility] Room Number: [phone] Place of Service:  ALF [phone]) Provider:  Toni Arthurs, NP-C  Crecencio Mc, MD  Patient Care Team: Crecencio Mc, MD as PCP - General (Internal Medicine)  Extended Emergency Contact Information Primary Emergency Contact: Wrightsman,Richard Address: [address], [place] [id number] Johnnette Litter of [place] Phone: [phone] Relation: Son Secondary Emergency Contact: [place] of [place] Phone: [phone] Relation: Son  Code Status:  DNR Goals of care: Advanced Directive information Advanced Directives 12/28/2016  Does Patient Have a Medical Advance Directive? Yes  Type of Advance Directive Out of facility DNR (pink MOST or yellow form);Healthcare Power of Attorney  Does patient want to make changes to medical advance directive? No - Patient declined  Copy of [place] in Chart? Yes     Chief Complaint  Patient presents with  . Medical Management of Chronic Issues    Routine Visit    HPI:  Pt is a 83 y.o. female seen today for medical management of chronic diseases.  Pt has been stable over this past month. No falls. Pt is contienet of bowel and bladder. Needs minimal assistance with ADL's. Enjoys being with other residents. Appetite is good. Typically eats 75-100% of meals. Staff reports she is sleeping well at night. Regular BMs. Poor eyesight. Mobile on unit without use of assistive devices. Pt has dementia. Unable to give complete ROS. VSS. No other complaints.   Past Medical History:  Diagnosis Date  . Diabetes mellitus    diet controlled  . Glaucoma   . Hypertension   . Vascular dementia    Past Surgical History:  Procedure Laterality Date  . CATARACT EXTRACTION      Allergies  Allergen Reactions  . Sulfa Antibiotics     Allergies as of 12/20/2016      Reactions   Sulfa Antibiotics       Medication List        Accurate as of  12/20/16 11:59 PM. Always use your most recent med list.          ACCU-CHEK SOFTCLIX LANCETS lancets 1 each by Other route daily. Use as instructed   amLODipine 5 MG tablet Commonly known as:  NORVASC Take 5 mg by mouth daily.   aspirin EC 81 MG tablet Take 81 mg by mouth daily.   brinzolamide 1 % ophthalmic suspension Commonly known as:  AZOPT Place 1 drop into both eyes every 12 (twelve) hours.   BYSTOLIC 20 MG Tabs Generic drug:  Nebivolol HCl Take 1 tablet by mouth daily   Cholecalciferol 2000 units Caps Take 6,000 Units by mouth daily. 3 caps   cyanocobalamin 1000 MCG tablet Take 1,000 mcg by mouth daily.   glipiZIDE 5 MG tablet Commonly known as:  GLUCOTROL Take 1 tablet by mouth every morning before breakfast   glucose blood test strip Commonly known as:  ACCU-CHEK AVIVA Check blood sugar twice daily Dx 250.00   ICAPS PO Take 1 tablet by mouth 2 (two) times daily.   losartan 100 MG tablet Commonly known as:  COZAAR Take 1 tablet by mouth daily   polyethylene glycol packet Commonly known as:  MIRALAX / GLYCOLAX Take 17 g by mouth daily.   sertraline 50 MG tablet Commonly known as:  ZOLOFT Take 50 mg by mouth daily.   spironolactone 25 MG tablet Commonly known as:  ALDACTONE Take 25 mg  by mouth daily.   traMADol 50 MG tablet Commonly known as:  ULTRAM Take 1 tablet (50 mg total) by mouth every 6 (six) hours as needed.       Review of Systems  Unable to perform ROS: Dementia  Constitutional: Negative for activity change, appetite change, chills, diaphoresis, fatigue and fever.  HENT: Negative for congestion, mouth sores, nosebleeds, postnasal drip, sneezing, sore throat, trouble swallowing and voice change.   Eyes: Negative.   Respiratory: Negative for apnea, cough, choking, chest tightness, shortness of breath and wheezing.   Cardiovascular: Negative for chest pain, palpitations and leg swelling.  Gastrointestinal: Negative for abdominal  distention, abdominal pain, constipation, diarrhea and nausea.  Endocrine: Negative.   Genitourinary: Negative for difficulty urinating, dysuria, frequency and urgency.  Musculoskeletal: Negative for back pain, gait problem and myalgias. Arthralgias: typical arthritis.  Skin: Negative for color change, pallor, rash and wound.  Allergic/Immunologic: Negative.   Neurological: Negative for dizziness, tremors, seizures, syncope, facial asymmetry, speech difficulty, weakness, numbness and headaches.  Hematological: Negative.   Psychiatric/Behavioral: Negative for agitation and behavioral problems.  All other systems reviewed and are negative.   Immunization History  Administered Date(s) Administered  . Influenza Split 11/30/2011, 12/05/2012, 11/13/2013  . Influenza-Unspecified 11/16/2014, 11/07/2016  . PPD Test 01/31/2016  . Pneumococcal Conjugate-13 04/23/2013  . Pneumococcal Polysaccharide-23 10/06/2011  . Tdap 09/15/2014  . Zoster 04/23/2005   Pertinent  Health Maintenance Due  Topic Date Due  . HEMOGLOBIN A1C  12/09/2015  . FOOT EXAM  03/07/2016  . OPHTHALMOLOGY EXAM  06/07/2017  . INFLUENZA VACCINE  Completed  . DEXA SCAN  Completed  . PNA vac Low Risk Adult  Completed   Fall Risk  02/16/2015 09/17/2014 11/08/2013 12/17/2012  Falls in the past year? No No No No   Functional Status Survey:    Vitals:   12/20/16 1420  BP: (!) 145/67  Pulse: 72  Resp: 16  Temp: 98.2 F (36.8 C)  TempSrc: Oral  SpO2: 97%  Weight: 114 lb 6.4 oz (51.9 kg)  Height: 5\' 5"  (1.651 m)   Body mass index is 19.04 kg/m. Physical Exam  Constitutional: She is oriented to person, place, and time. Vital signs are normal. She appears well-developed and well-nourished. She is active and cooperative. She does not appear ill. No distress.  HENT:  Head: Normocephalic and atraumatic.  Mouth/Throat: Uvula is midline, oropharynx is clear and moist and mucous membranes are normal. Mucous membranes are not pale,  not dry and not cyanotic.  Eyes: Conjunctivae, EOM and lids are normal. Pupils are equal, round, and reactive to light.  Neck: Trachea normal, normal range of motion and full passive range of motion without pain. Neck supple. No JVD present. No tracheal deviation, no edema and no erythema present. No thyromegaly present.  Cardiovascular: Normal rate, regular rhythm, normal heart sounds, intact distal pulses and normal pulses. Exam reveals no gallop, no distant heart sounds and no friction rub.  No murmur heard. Pulses:      Dorsalis pedis pulses are 2+ on the right side, and 2+ on the left side.  1+ BLE edema  Pulmonary/Chest: Effort normal and breath sounds normal. No accessory muscle usage. No respiratory distress. She has no decreased breath sounds. She has no wheezes. She has no rhonchi. She has no rales. She exhibits no tenderness.  Abdominal: Soft. Normal appearance and bowel sounds are normal. She exhibits no distension and no ascites. There is no tenderness.  Musculoskeletal: Normal range of motion. She exhibits  no edema or tenderness.  Expected osteoarthritis, stiffness  Neurological: She is alert and oriented to person, place, and time. She has normal strength.  Skin: Skin is warm, dry and intact. No rash noted. She is not diaphoretic. No cyanosis or erythema. No pallor. Nails show no clubbing.  Psychiatric: She has a normal mood and affect. Her speech is normal and behavior is normal. Judgment and thought content normal. Cognition and memory are normal.  Nursing note and vitals reviewed.   Labs reviewed: Recent Labs    07/11/16 0430 11/02/16 0500  NA 136 134*  K 4.9 4.5  CL 99* 100*  CO2 31 28  GLUCOSE 95 101*  BUN 29* 34*  CREATININE 0.76 1.03*  CALCIUM 9.2 9.1  MG  --  2.1   Recent Labs    07/11/16 0430 11/02/16 0500  AST 23 24  ALT 34 36  ALKPHOS 103 72  BILITOT 1.1 1.0  PROT 6.7 6.4*  ALBUMIN 3.8 3.8   Recent Labs    11/02/16 0500  WBC 8.3  NEUTROABS 5.4   HGB 11.6*  HCT 33.5*  MCV 86.2  PLT 246   Lab Results  Component Value Date   TSH 1.909 11/02/2016   Lab Results  Component Value Date   HGBA1C 7.3 (H) 06/09/2015   Lab Results  Component Value Date   CHOL 201 (H) 06/09/2015   HDL 81.90 06/09/2015   LDLCALC 103 (H) 06/09/2015   LDLDIRECT 97.0 06/09/2015   TRIG 81.0 06/09/2015   CHOLHDL 2 06/09/2015    Significant Diagnostic Results in last 30 days:  No results found.  Assessment/Plan 1. Essential hypertension  Stable  Continue Amlodipine 5 mg po Q Day  Continue ASA 81 mg po Q Day  Continue Bystolic 20 mg po Q Day  Continue Losartan 100 mg po Q day  2. Vascular dementia, uncomplicated  Stable  3. Major depressive disorder with single episode, remission status unspecified  Stable  Continue Sertraline 50 mg po Q Day  4. Glaucoma of both eyes, unspecified glaucoma type  Stable  Continue Azopt 1% 1 drop in both eyes Q 12 hours  5. Age-related osteoporosis without current pathological fracture  Stable seen  6. 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 Constipation, unspecified constipation type  Stable  Continue Miralax 17 g PO Q day- mix with 4-8 ounces of fluid  8. Left bundle branch block  Stable  9. Chronic systolic congestive heart failure (HCC)  Stable  10.  Vitamin B12 deficiency  Stable   Continue Cyanocobalamin 1000 mcg - 1 tablet p.o. Daily  11.  Vitamin D deficiency  Cholecalciferol 2000 units- 3 capsules p.o. daily times 12 weeks, then  Cholecalciferol 2000 units-1 capsule p.o. daily  Continue all medications as listed above (12/20/16)  Family/ staff Communication:   Total Time:  Documentation:  Face to Face:  Family/Phone:   Labs/tests ordered: Not due  Medication list reviewed and  assessed for continued appropriateness.  Vikki Ports, NP-C Geriatrics [facility] [phone], [place] [address] Cell Phone (Mon-Fri 8am-5pm):  [phone] On Call:  [phone] & follow prompts after 5pm & weekends Office Phone:  [phone] Office Fax:  [phone]

## 2017-02-13 ENCOUNTER — Encounter
Admission: RE | Admit: 2017-02-13 | Discharge: 2017-02-13 | Disposition: A | Discharge status: Home / Self Care | Payer: PPO | Source: Ambulatory Visit | Attending: Internal Medicine | Admitting: Internal Medicine

## 2017-02-13 DIAGNOSIS — M6281 Muscle weakness (generalized): Secondary | ICD-10-CM | POA: Diagnosis not present

## 2017-02-13 DIAGNOSIS — R262 Difficulty in walking, not elsewhere classified: Secondary | ICD-10-CM | POA: Diagnosis not present

## 2017-02-13 DIAGNOSIS — I10 Essential (primary) hypertension: Secondary | ICD-10-CM | POA: Diagnosis not present

## 2017-02-13 DIAGNOSIS — J449 Chronic obstructive pulmonary disease, unspecified: Secondary | ICD-10-CM | POA: Diagnosis not present

## 2017-03-01 ENCOUNTER — Encounter: Payer: Self-pay | Admitting: Gerontology

## 2017-03-01 ENCOUNTER — Non-Acute Institutional Stay: Payer: PPO | Admitting: Gerontology

## 2017-03-01 DIAGNOSIS — F0150 Vascular dementia without behavioral disturbance: Secondary | ICD-10-CM | POA: Diagnosis not present

## 2017-03-01 DIAGNOSIS — I447 Left bundle-branch block, unspecified: Secondary | ICD-10-CM | POA: Diagnosis not present

## 2017-03-01 DIAGNOSIS — I10 Essential (primary) hypertension: Secondary | ICD-10-CM | POA: Diagnosis not present

## 2017-03-05 NOTE — Progress Notes (Signed)
Location:    [facility] Home Room Number: 607P Place of Service:  ALF [phone]) Provider:  Toni Arthurs, NP-C  Crecencio Mc, MD  Patient Care Team: Crecencio Mc, MD as PCP - General (Internal Medicine)  Extended Emergency Contact Information Primary Emergency Contact: Hillman,Richard Address: [address], [place] [id number] Johnnette Litter of [place] Phone: [phone] Relation: Son Secondary Emergency Contact: [place] of [place] Phone: [phone] Relation: Son  Code Status:  DNR Goals of care: Advanced Directive information Advanced Directives 03/01/2017  Does Patient Have a Medical Advance Directive? Yes  Type of Paramedic of [place];Out of facility DNR (pink MOST or yellow form)  Does patient want to make changes to medical advance directive? No - Patient declined  Copy of [place] in Chart? Yes  Pre-existing out of facility DNR order (yellow form or pink MOST form) Yellow form placed in chart (order not valid for inpatient use)     Chief Complaint  Patient presents with  . Medical Management of Chronic Issues    Routine Visit    HPI:  Pt is a 84 y.o. female seen today for medical management of chronic diseases.    Vascular dementia, uncomplicated Stable. Pt now lives on [place] [address].   Left bundle branch block Stable. No episodes of syncope. Pt denies chest pain or shortness of breath.   Hypertension Stable. No recent episodes of hypotension. Symptoms controlled on spironolactone, cozaar, bystolic and norvasc.   Please note pt with limited verbal ability/ dementia. Unable to obtain complete ROS. Some ROS info obtained from staff and documentation.   Past Medical History:  Diagnosis Date  . Diabetes mellitus    diet controlled  . Glaucoma   . Hypertension   . Vascular dementia    Past Surgical History:  Procedure Laterality Date  . CATARACT EXTRACTION      Allergies    Allergen Reactions  . Sulfa Antibiotics     Allergies as of 03/01/2017      Reactions   Sulfa Antibiotics       Medication List        Accurate as of 03/01/17 11:59 PM. Always use your most recent med list.          ACCU-CHEK SOFTCLIX LANCETS lancets 1 each by Other route daily. Use as instructed   amLODipine 5 MG tablet Commonly known as:  NORVASC Take 5 mg by mouth daily.   aspirin EC 81 MG tablet Take 81 mg by mouth daily.   BREO ELLIPTA 100-25 MCG/INH Aepb Generic drug:  fluticasone furoate-vilanterol Inhale 1 puff into the lungs daily.   brinzolamide 1 % ophthalmic suspension Commonly known as:  AZOPT Place 1 drop into both eyes every 12 (twelve) hours.   BYSTOLIC 20 MG Tabs Generic drug:  Nebivolol HCl Take 1 tablet by mouth daily   Cholecalciferol 2000 units Caps Take 1 capsule by mouth daily.   cyanocobalamin 1000 MCG tablet Take 1,000 mcg by mouth daily.   glipiZIDE 5 MG tablet Commonly known as:  GLUCOTROL Take 1 tablet by mouth every morning before breakfast   glucose blood test strip Commonly known as:  ACCU-CHEK AVIVA Check blood sugar twice daily Dx 250.00   ICAPS PO Take 1 tablet by mouth 2 (two) times daily.   losartan 100 MG tablet Commonly known as:  COZAAR Take 1 tablet by mouth daily   polyethylene glycol  packet Commonly known as:  MIRALAX / GLYCOLAX Take 17 g by mouth daily.   pravastatin 10 MG tablet Commonly known as:  PRAVACHOL Take 10 mg by mouth once a week. On Sunday   sertraline 50 MG tablet Commonly known as:  ZOLOFT Take 50 mg by mouth daily.   spironolactone 25 MG tablet Commonly known as:  ALDACTONE Take 25 mg by mouth daily.   traMADol 50 MG tablet Commonly known as:  ULTRAM Take 1 tablet (50 mg total) by mouth every 6 (six) hours as needed.       Review of Systems  Unable to perform ROS: Dementia  Constitutional: Negative for activity change, appetite change, chills, diaphoresis and fever.  HENT:  Negative for congestion, mouth sores, nosebleeds, postnasal drip, sneezing, sore throat, trouble swallowing and voice change.   Respiratory: Negative for apnea, cough, choking, chest tightness, shortness of breath and wheezing.   Cardiovascular: Negative for chest pain, palpitations and leg swelling.  Gastrointestinal: Negative for abdominal distention, abdominal pain, constipation, diarrhea and nausea.  Genitourinary: Negative for difficulty urinating, dysuria, frequency and urgency.  Musculoskeletal: Positive for arthralgias (typical arthritis). Negative for back pain, gait problem and myalgias.  Skin: Negative for color change, pallor, rash and wound.  Neurological: Negative for dizziness, tremors, syncope, speech difficulty, weakness, numbness and headaches.  Psychiatric/Behavioral: Positive for confusion. Negative for agitation and behavioral problems.  All other systems reviewed and are negative.   Immunization History  Administered Date(s) Administered  . Influenza Split 11/30/2011, 12/05/2012, 11/13/2013  . Influenza-Unspecified 11/16/2014, 11/07/2016  . PPD Test 01/31/2016  . Pneumococcal Conjugate-13 04/23/2013  . Pneumococcal Polysaccharide-23 10/06/2011  . Tdap 09/15/2014  . Zoster 04/23/2005   Pertinent  Health Maintenance Due  Topic Date Due  . HEMOGLOBIN A1C  12/09/2015  . FOOT EXAM  03/07/2016  . OPHTHALMOLOGY EXAM  06/07/2017  . INFLUENZA VACCINE  Completed  . DEXA SCAN  Completed  . PNA vac Low Risk Adult  Completed   Fall Risk  02/16/2015 09/17/2014 11/08/2013 12/17/2012  Falls in the past year? No No No No   Functional Status Survey:    Vitals:   03/01/17 1107  BP: 133/74  Pulse: 83  Resp: 18  Temp: 97.6 F (36.4 C)  TempSrc: Oral  SpO2: 97%  Weight: 102 lb 11.2 oz (46.6 kg)  Height: 5\' 5"  (1.651 m)   Body mass index is 17.09 kg/m. Physical Exam  Constitutional: Vital signs are normal. She appears well-developed and well-nourished. She is active and  cooperative. She does not appear ill. No distress.  HENT:  Head: Normocephalic and atraumatic.  Mouth/Throat: Uvula is midline, oropharynx is clear and moist and mucous membranes are normal. Mucous membranes are not pale, not dry and not cyanotic.  Eyes: Conjunctivae, EOM and lids are normal. Pupils are equal, round, and reactive to light.  Neck: Trachea normal, normal range of motion and full passive range of motion without pain. Neck supple. No JVD present. No tracheal deviation, no edema and no erythema present. No thyromegaly present.  Cardiovascular: Normal rate, normal heart sounds, intact distal pulses and normal pulses. An irregular rhythm present. Exam reveals no gallop, no distant heart sounds and no friction rub.  No murmur heard. Pulses:      Dorsalis pedis pulses are 2+ on the right side, and 2+ on the left side.  No edema  Pulmonary/Chest: Effort normal and breath sounds normal. No accessory muscle usage. No respiratory distress. She has no decreased breath sounds. She has  no wheezes. She has no rhonchi. She has no rales. She exhibits no tenderness.  Abdominal: Soft. Normal appearance and bowel sounds are normal. She exhibits no distension and no ascites. There is no tenderness.  Musculoskeletal: Normal range of motion. She exhibits no edema or tenderness.  Expected osteoarthritis, stiffness; Bilateral Calves soft, supple. Negative Homan's Sign. B- pedal pulses equal  Neurological: She is alert. She has normal strength. Coordination abnormal.  Skin: Skin is warm, dry and intact. She is not diaphoretic. No cyanosis. No pallor. Nails show no clubbing.  Psychiatric: She has a normal mood and affect. Her speech is normal and behavior is normal. Judgment and thought content normal. Cognition and memory are impaired. She exhibits abnormal recent memory and abnormal remote memory.  Nursing note and vitals reviewed.   Labs reviewed: Recent Labs    07/11/16 0430 11/02/16 0500  NA 136  134*  K 4.9 4.5  CL 99* 100*  CO2 31 28  GLUCOSE 95 101*  BUN 29* 34*  CREATININE 0.76 1.03*  CALCIUM 9.2 9.1  MG  --  2.1   Recent Labs    07/11/16 0430 11/02/16 0500  AST 23 24  ALT 34 36  ALKPHOS 103 72  BILITOT 1.1 1.0  PROT 6.7 6.4*  ALBUMIN 3.8 3.8   Recent Labs    11/02/16 0500  WBC 8.3  NEUTROABS 5.4  HGB 11.6*  HCT 33.5*  MCV 86.2  PLT 246   Lab Results  Component Value Date   TSH 1.909 11/02/2016   Lab Results  Component Value Date   HGBA1C 7.3 (H) 06/09/2015   Lab Results  Component Value Date   CHOL 201 (H) 06/09/2015   HDL 81.90 06/09/2015   LDLCALC 103 (H) 06/09/2015   LDLDIRECT 97.0 06/09/2015   TRIG 81.0 06/09/2015   CHOLHDL 2 06/09/2015    Significant Diagnostic Results in last 30 days:  No results found.  Assessment/Plan Hasel was seen today for medical management of chronic issues.  Diagnoses and all orders for this visit:  Vascular dementia, uncomplicated  Left bundle branch block  Essential hypertension   above stated conditions stable  Continue current medication regimen  Monitor for hypotension  Monitor for episodes of syncope/presyncope  Monitor for worsening confusion  Family/ staff Communication:   Total Time:  Documentation:  Face to Face:  Family/Phone:   Labs/tests ordered:  Not due  Medication list reviewed and assessed for continued appropriateness. Monthly medication orders reviewed and signed.  Vikki Ports, NP-C Geriatrics [facility] [phone], [place] [address] Cell Phone (Mon-Fri 8am-5pm):  [phone] On Call:  [phone] & follow prompts after 5pm & weekends Office Phone:  [phone] Office Fax:  [phone]

## 2017-03-05 NOTE — Assessment & Plan Note (Signed)
Stable. Pt now lives on [address].

## 2017-03-05 NOTE — Assessment & Plan Note (Signed)
Stable. No recent episodes of hypotension. Symptoms controlled on spironolactone, cozaar, bystolic and norvasc.

## 2017-03-05 NOTE — Assessment & Plan Note (Signed)
Stable. No episodes of syncope. Pt denies chest pain or shortness of breath.

## 2017-03-16 ENCOUNTER — Encounter
Admission: RE | Admit: 2017-03-16 | Discharge: 2017-03-16 | Disposition: A | Discharge status: Home / Self Care | Payer: PPO | Source: Ambulatory Visit | Attending: Internal Medicine | Admitting: Internal Medicine

## 2017-03-27 ENCOUNTER — Non-Acute Institutional Stay: Payer: PPO | Admitting: Gerontology

## 2017-03-27 DIAGNOSIS — I5022 Chronic systolic (congestive) heart failure: Secondary | ICD-10-CM

## 2017-03-28 ENCOUNTER — Encounter: Payer: Self-pay | Admitting: Gerontology

## 2017-03-28 NOTE — Progress Notes (Signed)
Location:   [facility] Room Number: [phone] Place of Service:  ALF [phone]) Provider:  Toni Arthurs, NP-C  Crecencio Mc, MD  Patient Care Team: Crecencio Mc, MD as PCP - General (Internal Medicine)  Extended Emergency Contact Information Primary Emergency Contact: Derderian,Richard Address: [address], [place] [id number] Johnnette Litter of [place] Phone: [phone] Relation: Son Secondary Emergency Contact: [place] of [place] Phone: [phone] Relation: Son  Code Status: DNR Goals of care: Advanced Directive information Advanced Directives 03/27/2017  Does Patient Have a Medical Advance Directive? Yes  Type of Advance Directive Out of facility DNR (pink MOST or yellow form);Healthcare Power of Attorney  Does patient want to make changes to medical advance directive? No - Patient declined  Copy of [place] in Chart? Yes  Pre-existing out of facility DNR order (yellow form or pink MOST form) Yellow form placed in chart (order not valid for inpatient use)     Chief Complaint  Patient presents with  . Acute Visit    Recheck Edema    HPI:  Pt is a 84 y.o. female seen today for an acute visit for edema.  Patient has a history of chronic systolic congestive heart failure.  Patient has an EF of 35-45% at last check in 2013.  Staff had noticed increased in the patient's generalized edema.  BLE weeping with edema fluid-mild.  Right anterior leg with a 2 cm skin tear.  Otherwise, skin is intact.  No redness or warmth.  No signs of cellulitis.  Patient is afebrile.  Patient denies chest pain or shortness of breath.  No cough.  Patient is on 25 mg spironolactone daily for the fluid.  No TED hose in place.  Vital signs stable.  No other complaints.  Please note pt with limited verbal ability. Unable to obtain complete ROS. Some ROS info obtained from staff and documentation.    Past Medical History:    Diagnosis Date  . Diabetes mellitus    diet controlled  . Glaucoma   . Hypertension   . Vascular dementia    Past Surgical History:  Procedure Laterality Date  . CATARACT EXTRACTION      Allergies  Allergen Reactions  . Sulfa Antibiotics     Allergies as of 03/27/2017      Reactions   Sulfa Antibiotics       Medication List        Accurate as of 03/27/17 11:59 PM. Always use your most recent med list.          ACCU-CHEK SOFTCLIX LANCETS lancets 1 each by Other route daily. Use as instructed   amLODipine 5 MG tablet Commonly known as:  NORVASC Take 5 mg by mouth daily.   aspirin EC 81 MG tablet Take 81 mg by mouth daily.   BREO ELLIPTA 100-25 MCG/INH Aepb Generic drug:  fluticasone furoate-vilanterol Inhale 1 puff into the lungs daily.   brinzolamide 1 % ophthalmic suspension Commonly known as:  AZOPT Place 1 drop into both eyes every 12 (twelve) hours.   BYSTOLIC 20 MG Tabs Generic drug:  Nebivolol HCl Take 1 tablet by mouth daily   Cholecalciferol 2000 units Caps Take 1 capsule by mouth daily.   cyanocobalamin 1000 MCG tablet Take 1,000 mcg by mouth daily.   glipiZIDE 5 MG tablet Commonly known as:  GLUCOTROL Take 1 tablet by mouth every morning before breakfast  glucose blood test strip Commonly known as:  ACCU-CHEK AVIVA Check blood sugar twice daily Dx 250.00   ICAPS PO Take 1 tablet by mouth 2 (two) times daily.   losartan 100 MG tablet Commonly known as:  COZAAR Take 1 tablet by mouth daily   polyethylene glycol packet Commonly known as:  MIRALAX / GLYCOLAX Take 17 g by mouth daily.   pravastatin 10 MG tablet Commonly known as:  PRAVACHOL Take 10 mg by mouth once a week. On Sunday   sertraline 50 MG tablet Commonly known as:  ZOLOFT Take 50 mg by mouth daily.   spironolactone 25 MG tablet Commonly known as:  ALDACTONE Take 25 mg by mouth daily.   traMADol 50 MG tablet Commonly known as:  ULTRAM Take 1 tablet (50 mg  total) by mouth every 6 (six) hours as needed.       Review of Systems  Unable to perform ROS: Dementia  Constitutional: Negative for activity change, appetite change, chills, diaphoresis and fever.  HENT: Negative for congestion, mouth sores, nosebleeds, postnasal drip, sneezing, sore throat, trouble swallowing and voice change.   Respiratory: Negative for apnea, cough, choking, chest tightness, shortness of breath and wheezing.   Cardiovascular: Positive for leg swelling. Negative for chest pain and palpitations.  Gastrointestinal: Negative for abdominal distention, abdominal pain, constipation, diarrhea and nausea.  Genitourinary: Negative for difficulty urinating, dysuria, frequency and urgency.  Musculoskeletal: Negative for back pain, gait problem and myalgias. Arthralgias: typical arthritis.  Skin: Negative for color change, pallor, rash and wound.  Neurological: Negative for dizziness, tremors, syncope, speech difficulty, weakness, numbness and headaches.  Psychiatric/Behavioral: Negative for agitation and behavioral problems.  All other systems reviewed and are negative.   Immunization History  Administered Date(s) Administered  . Influenza Split 11/30/2011, 12/05/2012, 11/13/2013  . Influenza-Unspecified 11/16/2014, 11/07/2016  . PPD Test 01/31/2016  . Pneumococcal Conjugate-13 04/23/2013  . Pneumococcal Polysaccharide-23 10/06/2011  . Tdap 09/15/2014  . Zoster 04/23/2005   Pertinent  Health Maintenance Due  Topic Date Due  . HEMOGLOBIN A1C  12/09/2015  . FOOT EXAM  03/07/2016  . OPHTHALMOLOGY EXAM  06/07/2017  . INFLUENZA VACCINE  Completed  . DEXA SCAN  Completed  . PNA vac Low Risk Adult  Completed   Fall Risk  02/16/2015 09/17/2014 11/08/2013 12/17/2012  Falls in the past year? No No No No   Functional Status Survey:    Vitals:   03/27/17 1420  BP: (!) 172/86  Pulse: 89  Resp: 18  Temp: (!) 96.6 F (35.9 C)  TempSrc: Oral  SpO2: 97%  Weight: 108 lb (49  kg)  Height: _0  (1.651 m)   Body mass index is 17.97 kg/m. Physical Exam  Constitutional: She is oriented to person, place, and time. Vital signs are normal. She appears well-developed and well-nourished. She is active and cooperative. She does not appear ill. No distress.  HENT:  Head: Normocephalic and atraumatic.  Mouth/Throat: Uvula is midline, oropharynx is clear and moist and mucous membranes are normal. Mucous membranes are not pale, not dry and not cyanotic.  Eyes: Pupils are equal, round, and reactive to light. Conjunctivae, EOM and lids are normal.  Neck: Trachea normal, normal range of motion and full passive range of motion without pain. Neck supple. No JVD present. No tracheal deviation, no edema and no erythema present. No thyromegaly present.  Cardiovascular: Normal rate, regular rhythm, normal heart sounds, intact distal pulses and normal pulses. Exam reveals no gallop, no distant heart sounds and  no friction rub.  No murmur heard. Pulses:      Dorsalis pedis pulses are 2+ on the right side, and 2+ on the left side.  2+ BLE edema  Pulmonary/Chest: Effort normal and breath sounds normal. No accessory muscle usage. No respiratory distress. She has no decreased breath sounds. She has no wheezes. She has no rhonchi. She has no rales. She exhibits no tenderness.  Abdominal: Soft. Normal appearance and bowel sounds are normal. She exhibits no distension and no ascites. There is no tenderness.  Musculoskeletal: Normal range of motion. She exhibits no edema or tenderness.  Expected osteoarthritis, stiffness; Bilateral Calves soft, supple. Negative Homan's Sign. B- pedal pulses equal  Neurological: She is alert and oriented to person, place, and time. She has normal strength.  Skin: Skin is warm and dry. Laceration noted. She is not diaphoretic. No cyanosis. No pallor. Nails show no clubbing.  RLE anterior leg, 2 cm skin tear  Psychiatric: She has a normal mood and affect. Her  speech is normal and behavior is normal. Thought content normal. Cognition and memory are impaired. She expresses impulsivity. She exhibits abnormal recent memory and abnormal remote memory.  Nursing note and vitals reviewed.   Labs reviewed: Recent Labs    07/11/16 0430 11/02/16 0500  NA 136 134*  K 4.9 4.5  CL 99* 100*  CO2 31 28  GLUCOSE 95 101*  BUN 29* 34*  CREATININE 0.76 1.03*  CALCIUM 9.2 9.1  MG  --  2.1   Recent Labs    07/11/16 0430 11/02/16 0500  AST 23 24  ALT 34 36  ALKPHOS 103 72  BILITOT 1.1 1.0  PROT 6.7 6.4*  ALBUMIN 3.8 3.8   Recent Labs    11/02/16 0500  WBC 8.3  NEUTROABS 5.4  HGB 11.6*  HCT 33.5*  MCV 86.2  PLT 246   Lab Results  Component Value Date   TSH 1.909 11/02/2016   Lab Results  Component Value Date   HGBA1C 7.3 (H) 06/09/2015   Lab Results  Component Value Date   CHOL 201 (H) 06/09/2015   HDL 81.90 06/09/2015   LDLCALC 103 (H) 06/09/2015   LDLDIRECT 97.0 06/09/2015   TRIG 81.0 06/09/2015   CHOLHDL 2 06/09/2015    Significant Diagnostic Results in last 30 days:  No results found.  Assessment/Plan Chronic systolic congestive heart failure  TED hose-on in the early a.m., off in the p.m.  Elevate legs when at rest  Add torsemide 5 mg p.o. Daily  Follow-up labs  Allevyn dressing to skin tear.  Change every 5 days  Keep skin moisturized.  Continue to be followed by palliative care  Family/ staff Communication:   Total Time:  Documentation:  Face to Face:  Family/Phone:   Labs/tests ordered: CBC, met C, magnesium, vitamin B12, vitamin D, TSH, lipid panel, A1c  Medication list reviewed and assessed for continued appropriateness.  Vikki Ports, NP-C Geriatrics [facility] [phone],  [phone] Cell Phone (Mon-Fri 8am-5pm):  [phone] On Call:  [phone] & follow prompts after 5pm & weekends Office Phone:  [phone] Office Fax:   [phone]

## 2017-03-29 ENCOUNTER — Encounter: Payer: Self-pay | Admitting: Gerontology

## 2017-03-29 ENCOUNTER — Non-Acute Institutional Stay: Payer: PPO | Admitting: Gerontology

## 2017-03-29 DIAGNOSIS — E1139 Type 2 diabetes mellitus with other diabetic ophthalmic complication: Secondary | ICD-10-CM | POA: Diagnosis not present

## 2017-03-29 DIAGNOSIS — J431 Panlobular emphysema: Secondary | ICD-10-CM | POA: Diagnosis not present

## 2017-03-29 DIAGNOSIS — I5022 Chronic systolic (congestive) heart failure: Secondary | ICD-10-CM | POA: Diagnosis not present

## 2017-04-13 ENCOUNTER — Encounter
Admission: RE | Admit: 2017-04-13 | Discharge: 2017-04-13 | Disposition: A | Discharge status: Home / Self Care | Payer: PPO | Source: Ambulatory Visit | Attending: Internal Medicine | Admitting: Internal Medicine

## 2017-05-07 NOTE — Assessment & Plan Note (Signed)
Improved.  No edema.  Symptoms controlled with daily TED hose, legs elevated at rest, spironolactone 25 mg p.o. daily and torsemide 5 mg p.o. daily.  Patient denies chest pain or shortness of breath

## 2017-05-07 NOTE — Assessment & Plan Note (Addendum)
Stable.  A1c to be checked prior to next months assessment.  Symptoms controlled with glipizide 5 mg 1 tablet p.o. daily.  CBGs checked weekly and as needed. Azopt 1% drops to both eyes Q 12 hours for glaucoma

## 2017-05-07 NOTE — Assessment & Plan Note (Addendum)
Stable.  Patient is not on oxygen at this time.  Symptoms managed with Breo Ellipta 100-25 1 puff daily.  Oxygen saturations averaged between 96-98% on room air.

## 2017-05-07 NOTE — Progress Notes (Signed)
Location:    Nursing Home Room Number: 329J Place of Service:  SNF (31) Provider:  Toni Arthurs, NP-C  Kirk Ruths, MD  Patient Care Team: Kirk Ruths, MD as PCP - General (Internal Medicine) Toni Arthurs, NP as Nurse Practitioner [facility])  Extended Emergency Contact Information Primary Emergency Contact: Lebeck,Richard Address: [address], [place] [id number] Johnnette Litter of [place] Phone: [phone] Relation: Son Secondary Emergency Contact: [place] of [place] Phone: [phone] Relation: Son  Code Status:  DNR Goals of care: Advanced Directive information Advanced Directives 03/29/2017  Does Patient Have a Medical Advance Directive? Yes  Type of Paramedic of [place];Out of facility DNR (pink MOST or yellow form)  Does patient want to make changes to medical advance directive? No - Patient declined  Copy of [place] in Chart? Yes  Pre-existing out of facility DNR order (yellow form or pink MOST form) Yellow form placed in chart (order not valid for inpatient use)     Chief Complaint  Patient presents with  . Medical Management of Chronic Issues    Routine Visit    HPI:  Pt is a 84 y.o. female seen today for medical management of chronic diseases.    Chronic systolic congestive heart failure Improved.  No edema.  Symptoms controlled with daily TED hose, legs elevated at rest, spironolactone 25 mg p.o. daily and torsemide 5 mg p.o. daily.  Patient denies chest pain or shortness of breath  COPD with emphysema ([place]) Stable.  Patient is not on oxygen at this time.  Symptoms managed with Breo Ellipta 100-25 1 puff daily.  Oxygen saturations averaged between 96-98% on room air.  Type 2 diabetes mellitus with ophthalmic complication (HCC) Stable.  A1c to be checked prior to next months assessment.  Symptoms controlled with glipizide 5 mg 1 tablet p.o. daily.  CBGs  checked weekly and as needed. Azopt 1% drops to both eyes Q 12 hours for glaucoma  Please note pt with limited verbal ability. Unable to obtain complete ROS. Some ROS info obtained from staff and documentation.   Past Medical History:  Diagnosis Date  . Diabetes mellitus    diet controlled  . Glaucoma   . Hypertension   . Vascular dementia    Past Surgical History:  Procedure Laterality Date  . CATARACT EXTRACTION      Allergies  Allergen Reactions  . Sulfa Antibiotics     Allergies as of 03/29/2017      Reactions   Sulfa Antibiotics       Medication List        Accurate as of 03/29/17 11:59 PM. Always use your most recent med list.          ACCU-CHEK SOFTCLIX LANCETS lancets 1 each by Other route daily. Use as instructed   amLODipine 5 MG tablet Commonly known as:  NORVASC Take 5 mg by mouth daily.   aspirin EC 81 MG tablet Take 81 mg by mouth daily.   BREO ELLIPTA 100-25 MCG/INH Aepb Generic drug:  fluticasone furoate-vilanterol Inhale 1 puff into the lungs daily. Rinse mouth well after use   brinzolamide 1 % ophthalmic suspension Commonly known as:  AZOPT Place 1 drop into both eyes every 12 (twelve) hours.   BYSTOLIC 20 MG Tabs Generic drug:  Nebivolol HCl Take 1 tablet by mouth daily   Cholecalciferol 2000 units Caps Take 1 capsule by mouth daily.  cyanocobalamin 1000 MCG tablet Take 1,000 mcg by mouth daily.   glipiZIDE 5 MG tablet Commonly known as:  GLUCOTROL Take 1 tablet by mouth every morning before breakfast   glucose blood test strip Commonly known as:  ACCU-CHEK AVIVA Check blood sugar twice daily Dx 250.00   ICAPS PO Take 1 tablet by mouth 2 (two) times daily.   losartan 100 MG tablet Commonly known as:  COZAAR Take 1 tablet by mouth daily   polyethylene glycol packet Commonly known as:  MIRALAX / GLYCOLAX Take 17 g by mouth daily.   pravastatin 10 MG tablet Commonly known as:  PRAVACHOL Take 10 mg by mouth once a  week. On Sunday   sertraline 50 MG tablet Commonly known as:  ZOLOFT Take 50 mg by mouth daily.   spironolactone 25 MG tablet Commonly known as:  ALDACTONE Take 25 mg by mouth daily.   traMADol 50 MG tablet Commonly known as:  ULTRAM Take 1 tablet (50 mg total) by mouth every 6 (six) hours as needed.       Review of Systems  Unable to perform ROS: Dementia  Constitutional: Negative for activity change, appetite change, chills, diaphoresis and fever.  HENT: Negative for congestion, mouth sores, nosebleeds, postnasal drip, sneezing, sore throat, trouble swallowing and voice change.   Respiratory: Negative for apnea, cough, choking, chest tightness, shortness of breath and wheezing.   Cardiovascular: Negative for chest pain, palpitations and leg swelling.  Gastrointestinal: Negative for abdominal distention, abdominal pain, constipation, diarrhea and nausea.  Genitourinary: Negative for difficulty urinating, dysuria, frequency and urgency.  Musculoskeletal: Positive for arthralgias (typical arthritis). Negative for back pain, gait problem and myalgias.  Skin: Negative for color change, pallor, rash and wound.  Neurological: Negative for dizziness, tremors, syncope, speech difficulty, weakness, numbness and headaches.  Psychiatric/Behavioral: Negative for agitation and behavioral problems.  All other systems reviewed and are negative.   Immunization History  Administered Date(s) Administered  . Influenza Split 11/30/2011, 12/05/2012, 11/13/2013  . Influenza-Unspecified 11/16/2014, 11/07/2016  . PPD Test 01/31/2016  . Pneumococcal Conjugate-13 04/23/2013  . Pneumococcal Polysaccharide-23 10/06/2011  . Tdap 09/15/2014  . Zoster 04/23/2005   Pertinent  Health Maintenance Due  Topic Date Due  . HEMOGLOBIN A1C  12/09/2015  . FOOT EXAM  03/07/2016  . OPHTHALMOLOGY EXAM  06/07/2017  . INFLUENZA VACCINE  Completed  . DEXA SCAN  Completed  . PNA vac Low Risk Adult  Completed    Fall Risk  02/16/2015 09/17/2014 11/08/2013 12/17/2012  Falls in the past year? No No No No   Functional Status Survey:    Vitals:   03/29/17 1616  BP: 140/88  Pulse: 73  Resp: 18  Temp: (!) 97.3 F (36.3 C)  TempSrc: Oral  SpO2: 97%  Weight: 108 lb (49 kg)  Height: 5\' 5"  (1.651 m)   Body mass index is 17.97 kg/m. Physical Exam  Constitutional: She is oriented to person, place, and time. Vital signs are normal. She appears well-developed and well-nourished. She is active and cooperative. She does not appear ill. No distress.  HENT:  Head: Normocephalic and atraumatic.  Mouth/Throat: Uvula is midline, oropharynx is clear and moist and mucous membranes are normal. Mucous membranes are not pale, not dry and not cyanotic.  Eyes: Pupils are equal, round, and reactive to light. Conjunctivae, EOM and lids are normal.  Neck: Trachea normal, normal range of motion and full passive range of motion without pain. Neck supple. No JVD present. No tracheal deviation, no edema  and no erythema present. No thyromegaly present.  Cardiovascular: Normal rate, regular rhythm, normal heart sounds, intact distal pulses and normal pulses. Exam reveals no gallop, no distant heart sounds and no friction rub.  No murmur heard. Pulses:      Dorsalis pedis pulses are 2+ on the right side, and 2+ on the left side.  No edema  Pulmonary/Chest: Effort normal and breath sounds normal. No accessory muscle usage. No respiratory distress. She has no decreased breath sounds. She has no wheezes. She has no rhonchi. She has no rales. She exhibits no tenderness.  Abdominal: Soft. Normal appearance and bowel sounds are normal. She exhibits no distension and no ascites. There is no tenderness.  Musculoskeletal: Normal range of motion. She exhibits no edema or tenderness.  Expected osteoarthritis, stiffness; Bilateral Calves soft, supple. Negative Homan's Sign. B- pedal pulses equal  Neurological: She is alert and oriented to  person, place, and time. She has normal strength.  Skin: Skin is warm, dry and intact. She is not diaphoretic. No cyanosis. No pallor. Nails show no clubbing.  Psychiatric: She has a normal mood and affect. Her speech is normal and behavior is normal. Thought content normal. Cognition and memory are impaired. She expresses impulsivity. She exhibits abnormal recent memory and abnormal remote memory.  Nursing note and vitals reviewed.   Labs reviewed: Recent Labs    07/11/16 0430 11/02/16 0500  NA 136 134*  K 4.9 4.5  CL 99* 100*  CO2 31 28  GLUCOSE 95 101*  BUN 29* 34*  CREATININE 0.76 1.03*  CALCIUM 9.2 9.1  MG  --  2.1   Recent Labs    07/11/16 0430 11/02/16 0500  AST 23 24  ALT 34 36  ALKPHOS 103 72  BILITOT 1.1 1.0  PROT 6.7 6.4*  ALBUMIN 3.8 3.8   Recent Labs    11/02/16 0500  WBC 8.3  NEUTROABS 5.4  HGB 11.6*  HCT 33.5*  MCV 86.2  PLT 246   Lab Results  Component Value Date   TSH 1.909 11/02/2016   Lab Results  Component Value Date   HGBA1C 7.3 (H) 06/09/2015   Lab Results  Component Value Date   CHOL 201 (H) 06/09/2015   HDL 81.90 06/09/2015   LDLCALC 103 (H) 06/09/2015   LDLDIRECT 97.0 06/09/2015   TRIG 81.0 06/09/2015   CHOLHDL 2 06/09/2015    Significant Diagnostic Results in last 30 days:  No results found.  Assessment/Plan [place] was seen today for medical management of chronic issues.  Diagnoses and all orders for this visit:  Chronic systolic congestive heart failure ([place])  Panlobular emphysema (HCC)  Type 2 diabetes mellitus with other ophthalmic complication, without long-term current use of insulin ([place])   Above listed conditions stable  Continue current medication regimen  Continue to encourage pt to elevate legs when at rest  Daily TED hose  Continue to monitor CBGs Q week and prn  Monitor for s/s of hypoglycemia  Eye exam when available  Monthly weights and prn  Family/ staff Communication:   Total  Time:  Documentation:  Face to Face:  Family/Phone:   Labs/tests ordered:  See previous note  Medication list reviewed and assessed for continued appropriateness. Monthly medication orders reviewed and signed.  Vikki Ports, NP-C Geriatrics [facility] [phone], [place] [address] Cell Phone (Mon-Fri 8am-5pm):  [phone] On Call:  [phone] & follow prompts after 5pm & weekends Office Phone:  [phone]  Office Fax:  [phone]

## 2017-05-08 ENCOUNTER — Other Ambulatory Visit
Admission: RE | Admit: 2017-05-08 | Discharge: 2017-05-08 | Disposition: A | Discharge status: Home / Self Care | Payer: PPO | Source: Skilled Nursing Facility | Attending: Internal Medicine | Admitting: Internal Medicine

## 2017-05-08 DIAGNOSIS — E1139 Type 2 diabetes mellitus with other diabetic ophthalmic complication: Secondary | ICD-10-CM | POA: Diagnosis not present

## 2017-05-08 LAB — CBC
HCT: 37 % (ref 35.0–47.0)
HEMOGLOBIN: 12 g/dL (ref 12.0–16.0)
MCH: 28.6 pg (ref 26.0–34.0)
MCHC: 32.5 g/dL (ref 32.0–36.0)
MCV: 88.2 fL (ref 80.0–100.0)
Platelets: 313 10*3/uL (ref 150–440)
RBC: 4.19 MIL/uL (ref 3.80–5.20)
RDW: 14.1 % (ref 11.5–14.5)
WBC: 13.9 10*3/uL — ABNORMAL HIGH (ref 3.6–11.0)

## 2017-05-08 LAB — COMPREHENSIVE METABOLIC PANEL
ALBUMIN: 3.6 g/dL (ref 3.5–5.0)
ALK PHOS: 88 U/L (ref 38–126)
ALT: 22 U/L (ref 14–54)
ANION GAP: 8 (ref 5–15)
AST: 18 U/L (ref 15–41)
BUN: 24 mg/dL — AB (ref 6–20)
CALCIUM: 8.8 mg/dL — AB (ref 8.9–10.3)
CO2: 28 mmol/L (ref 22–32)
Chloride: 95 mmol/L — ABNORMAL LOW (ref 101–111)
Creatinine, Ser: 0.74 mg/dL (ref 0.44–1.00)
GFR calc Af Amer: >60 mL/min (ref >60)
GFR calc non Af Amer: >60 mL/min (ref >60)
Glucose, Bld: 223 mg/dL — ABNORMAL HIGH (ref 65–99)
POTASSIUM: 4.2 mmol/L (ref 3.5–5.1)
SODIUM: 131 mmol/L — AB (ref 135–145)
Total Bilirubin: 0.8 mg/dL (ref 0.3–1.2)
Total Protein: 6.6 g/dL (ref 6.5–8.1)

## 2017-05-08 LAB — LIPID PANEL
CHOL/HDL RATIO: 2.8 ratio
Cholesterol: 167 mg/dL (ref 0–200)
HDL: 59 mg/dL (ref >40)
LDL Cholesterol: 89 mg/dL (ref 0–99)
Triglycerides: 95 mg/dL (ref <150)
VLDL: 19 mg/dL (ref 0–40)

## 2017-05-08 LAB — TSH: TSH: 0.986 u[IU]/mL (ref 0.350–4.500)

## 2017-05-08 LAB — MAGNESIUM: MAGNESIUM: 2.1 mg/dL (ref 1.7–2.4)

## 2017-05-08 LAB — VITAMIN B12: Vitamin B-12: 1150 pg/mL — ABNORMAL HIGH (ref 180–914)

## 2017-05-09 ENCOUNTER — Non-Acute Institutional Stay: Payer: PPO | Admitting: Gerontology

## 2017-05-09 DIAGNOSIS — I10 Essential (primary) hypertension: Secondary | ICD-10-CM | POA: Diagnosis not present

## 2017-05-09 DIAGNOSIS — I447 Left bundle-branch block, unspecified: Secondary | ICD-10-CM

## 2017-05-09 DIAGNOSIS — F0150 Vascular dementia without behavioral disturbance: Secondary | ICD-10-CM | POA: Diagnosis not present

## 2017-05-09 LAB — HEMOGLOBIN A1C
Hgb A1c MFr Bld: 6.8 % — ABNORMAL HIGH (ref 4.8–5.6)
MEAN PLASMA GLUCOSE: 148.46 mg/dL

## 2017-05-09 LAB — VITAMIN D 25 HYDROXY (VIT D DEFICIENCY, FRACTURES): VIT D 25 HYDROXY: 41.7 ng/mL (ref 30.0–100.0)

## 2017-05-09 NOTE — Assessment & Plan Note (Signed)
Stable. No c/o chest pain or shortness of breath

## 2017-05-09 NOTE — Progress Notes (Signed)
Location:      Place of Service:  ALF (13) Provider:  Toni Arthurs, NP-C  Kirk Ruths, MD  Patient Care Team: Kirk Ruths, MD as PCP - General (Internal Medicine) Toni Arthurs, NP as Nurse Practitioner [facility])  Extended Emergency Contact Information Primary Emergency Contact: Martelle,Richard Address: [address], [place] [id number] Johnnette Litter of [place] Phone: [phone] Relation: Son Secondary Emergency Contact: [place] of [place] Phone: [phone] Relation: Son  Code Status:  DNR Goals of care: Advanced Directive information Advanced Directives 03/29/2017  Does Patient Have a Medical Advance Directive? Yes  Type of Paramedic of [place];Out of facility DNR (pink MOST or yellow form)  Does patient want to make changes to medical advance directive? No - Patient declined  Copy of [place] in Chart? Yes  Pre-existing out of facility DNR order (yellow form or pink MOST form) Yellow form placed in chart (order not valid for inpatient use)     Chief Complaint  Patient presents with  . Medical Management of Chronic Issues    HPI:  Pt is a 84 y.o. female seen today for medical management of chronic diseases.    Vascular dementia, uncomplicated Rapid decline. No longer able to dress her self. Having more confusion. Wandering, looking for her car. Unable to find her room at times. Asking for assistance cutting food up. Asking for assistance with feeding. Decreased po intake of food and fluid.   Hypertension Stable. No recent episodes of hyper or hypotension as of late, symptoms controlled with Cozaar, Bystolic, Norvasc and Torsemide. No c/o chest pain or shortness of breath  Left bundle branch block Stable. No c/o chest pain or shortness of breath  Please note pt with limited verbal/cognitive ability. Unable to obtain complete ROS. Some ROS info obtained from staff  and documentation.   Past Medical History:  Diagnosis Date  . Diabetes mellitus    diet controlled  . Glaucoma   . Hypertension   . Vascular dementia    Past Surgical History:  Procedure Laterality Date  . CATARACT EXTRACTION      Allergies  Allergen Reactions  . Sulfa Antibiotics     Allergies as of 05/09/2017      Reactions   Sulfa Antibiotics       Medication List        Accurate as of 05/09/17  5:29 PM. Always use your most recent med list.          ACCU-CHEK SOFTCLIX LANCETS lancets 1 each by Other route daily. Use as instructed   acetaminophen 325 MG tablet Commonly known as:  TYLENOL Take 650 mg by mouth every 4 (four) hours as needed. for pain and/or increased temp.   amLODipine 5 MG tablet Commonly known as:  NORVASC Take 5 mg by mouth daily.   aspirin EC 81 MG tablet Take 81 mg by mouth daily.   BREO ELLIPTA 100-25 MCG/INH Aepb Generic drug:  fluticasone furoate-vilanterol Inhale 1 puff into the lungs daily. Rinse mouth well after use   brinzolamide 1 % ophthalmic suspension Commonly known as:  AZOPT Place 1 drop into both eyes every 12 (twelve) hours.   BYSTOLIC 20 MG Tabs Generic drug:  Nebivolol HCl Take 1 tablet by mouth daily   Cholecalciferol 2000 units Caps Take 1 capsule by mouth daily.   cyanocobalamin 1000 MCG tablet Take 1,000 mcg by mouth daily.  glipiZIDE 5 MG tablet Commonly known as:  GLUCOTROL Take 1 tablet by mouth every morning before breakfast   glucose blood test strip Commonly known as:  ACCU-CHEK AVIVA Check blood sugar twice daily Dx 250.00   ICAPS PO Take 1 tablet by mouth 2 (two) times daily.   losartan 100 MG tablet Commonly known as:  COZAAR Take 1 tablet by mouth daily   polyethylene glycol packet Commonly known as:  MIRALAX / GLYCOLAX Take 17 g by mouth daily.   pravastatin 10 MG tablet Commonly known as:  PRAVACHOL Take 10 mg by mouth once a week. On Sunday   sertraline 50 MG  tablet Commonly known as:  ZOLOFT Take 50 mg by mouth daily.   spironolactone 25 MG tablet Commonly known as:  ALDACTONE Take 25 mg by mouth daily.   torsemide 5 MG tablet Commonly known as:  DEMADEX Take 5 mg by mouth daily.   traMADol 50 MG tablet Commonly known as:  ULTRAM Take 1 tablet (50 mg total) by mouth every 6 (six) hours as needed.       Review of Systems  Unable to perform ROS: Dementia  Constitutional: Positive for activity change, appetite change and fatigue. Negative for chills, diaphoresis and fever.  HENT: Negative for congestion, mouth sores, nosebleeds, postnasal drip, sneezing, sore throat, trouble swallowing and voice change.   Respiratory: Negative for apnea, cough, choking, chest tightness, shortness of breath and wheezing.   Cardiovascular: Negative for chest pain, palpitations and leg swelling.  Gastrointestinal: Negative for abdominal distention, abdominal pain, constipation, diarrhea and nausea.  Genitourinary: Negative for difficulty urinating, dysuria, frequency and urgency.  Musculoskeletal: Positive for arthralgias (typical arthritis). Negative for back pain, gait problem and myalgias.  Skin: Negative for color change, pallor, rash and wound.  Neurological: Positive for weakness. Negative for dizziness, tremors, syncope, speech difficulty, numbness and headaches.  Psychiatric/Behavioral: Positive for confusion. Negative for agitation and behavioral problems.  All other systems reviewed and are negative.   Immunization History  Administered Date(s) Administered  . Influenza Split 11/30/2011, 12/05/2012, 11/13/2013  . Influenza-Unspecified 11/16/2014, 11/07/2016  . PPD Test 01/31/2016  . Pneumococcal Conjugate-13 04/23/2013  . Pneumococcal Polysaccharide-23 10/06/2011  . Tdap 09/15/2014  . Zoster 04/23/2005   Pertinent  Health Maintenance Due  Topic Date Due  . HEMOGLOBIN A1C  12/09/2015  . FOOT EXAM  03/07/2016  . OPHTHALMOLOGY EXAM   06/07/2017  . INFLUENZA VACCINE  Completed  . DEXA SCAN  Completed  . PNA vac Low Risk Adult  Completed   Fall Risk  02/16/2015 09/17/2014 11/08/2013 12/17/2012  Falls in the past year? No No No No   Functional Status Survey:    Vitals:   05/09/17 0930  BP: (!) 118/59  Pulse: 80   There is no height or weight on file to calculate BMI. Physical Exam  Constitutional: Vital signs are normal. She appears well-developed and well-nourished. She is active and cooperative. She does not appear ill. No distress.  HENT:  Head: Normocephalic and atraumatic.  Mouth/Throat: Uvula is midline, oropharynx is clear and moist and mucous membranes are normal. Mucous membranes are not pale, not dry and not cyanotic.  Eyes: Pupils are equal, round, and reactive to light. Conjunctivae, EOM and lids are normal.  Neck: Trachea normal, normal range of motion and full passive range of motion without pain. Neck supple. No JVD present. No tracheal deviation, no edema and no erythema present. No thyromegaly present.  Cardiovascular: Normal rate, regular rhythm, normal heart sounds,  intact distal pulses and normal pulses. Exam reveals no gallop, no distant heart sounds and no friction rub.  No murmur heard. Pulses:      Dorsalis pedis pulses are 2+ on the right side, and 2+ on the left side.  No edema  Pulmonary/Chest: Effort normal. No accessory muscle usage. No respiratory distress. She has decreased breath sounds in the right lower field and the left lower field. She has no wheezes. She has no rhonchi. She has no rales. She exhibits no tenderness.  Abdominal: Soft. Normal appearance and bowel sounds are normal. She exhibits no distension and no ascites. There is no tenderness.  Musculoskeletal: Normal range of motion. She exhibits no edema or tenderness.  Expected osteoarthritis, stiffness; Bilateral Calves soft, supple. Negative Homan's Sign. B- pedal pulses equal; increased weakness  Neurological: She is alert. She  has normal strength. Coordination and gait abnormal.  Skin: Skin is warm, dry and intact. She is not diaphoretic. No cyanosis. No pallor. Nails show no clubbing.  Psychiatric: She has a normal mood and affect. Her speech is normal and behavior is normal. Thought content normal. Cognition and memory are impaired. She expresses impulsivity. She exhibits abnormal recent memory and abnormal remote memory.  Nursing note and vitals reviewed.   Labs reviewed: Recent Labs    07/11/16 0430 11/02/16 0500 05/08/17 0505  NA 136 134* 131*  K 4.9 4.5 4.2  CL 99* 100* 95*  CO2 31 28 28   GLUCOSE 95 101* 223*  BUN 29* 34* 24*  CREATININE 0.76 1.03* 0.74  CALCIUM 9.2 9.1 8.8*  MG  --  2.1 2.1   Recent Labs    07/11/16 0430 11/02/16 0500 05/08/17 0505  AST 23 24 18   ALT 34 36 22  ALKPHOS 103 72 88  BILITOT 1.1 1.0 0.8  PROT 6.7 6.4* 6.6  ALBUMIN 3.8 3.8 3.6   Recent Labs    11/02/16 0500 05/08/17 0505  WBC 8.3 13.9*  NEUTROABS 5.4  --   HGB 11.6* 12.0  HCT 33.5* 37.0  MCV 86.2 88.2  PLT 246 313   Lab Results  Component Value Date   TSH 0.986 05/08/2017   Lab Results  Component Value Date   HGBA1C 6.8 (H) 05/08/2017   Lab Results  Component Value Date   CHOL 167 05/08/2017   HDL 59 05/08/2017   LDLCALC 89 05/08/2017   LDLDIRECT 97.0 06/09/2015   TRIG 95 05/08/2017   CHOLHDL 2.8 05/08/2017    Significant Diagnostic Results in last 30 days:  No results found.  Assessment/Plan [place] was seen today for medical management of chronic issues.  Diagnoses and all orders for this visit:  Vascular dementia, uncomplicated  Essential hypertension  Left bundle branch block   Vascular dementia variable  Other above conditions stable  Labs show leukocytosis of unknown origin  Obtain UA, C&S  Repeat CBC Thursday  Monitor blood pressures  Monitor for safety, falls  Family/ staff Communication:   Total Time:  Documentation:  Face to  Face:  Family/Phone:   Labs/tests ordered:  Labs obtained reviewed  Medication list reviewed and assessed for continued appropriateness. Monthly medication orders reviewed and signed.  Vikki Ports, NP-C Geriatrics [facility] [phone], [place] [address] Cell Phone (Mon-Fri 8am-5pm):  [phone] On Call:  [phone] & follow prompts after 5pm & weekends Office Phone:  [phone] Office Fax:  [phone]

## 2017-05-09 NOTE — Assessment & Plan Note (Signed)
Stable. No recent episodes of hyper or hypotension as of late, symptoms controlled with Cozaar, Bystolic, Norvasc and Torsemide. No c/o chest pain or shortness of breath

## 2017-05-09 NOTE — Assessment & Plan Note (Addendum)
Rapid decline. No longer able to dress her self. Having more confusion. Wandering, looking for her car. Unable to find her room at times. Asking for assistance cutting food up. Asking for assistance with feeding. Decreased po intake of food and fluid.

## 2017-05-10 ENCOUNTER — Other Ambulatory Visit
Admission: RE | Admit: 2017-05-10 | Discharge: 2017-05-10 | Disposition: A | Discharge status: Home / Self Care | Payer: PPO | Source: Ambulatory Visit | Attending: Gerontology | Admitting: Gerontology

## 2017-05-10 DIAGNOSIS — E1139 Type 2 diabetes mellitus with other diabetic ophthalmic complication: Secondary | ICD-10-CM | POA: Diagnosis not present

## 2017-05-10 LAB — URINALYSIS, COMPLETE (UACMP) WITH MICROSCOPIC
BILIRUBIN URINE: NEGATIVE
Bacteria, UA: NONE SEEN
Glucose, UA: NEGATIVE mg/dL
HGB URINE DIPSTICK: NEGATIVE
Ketones, ur: NEGATIVE mg/dL
Leukocytes, UA: NEGATIVE
NITRITE: NEGATIVE
Protein, ur: NEGATIVE mg/dL
SPECIFIC GRAVITY, URINE: 1.016 (ref 1.005–1.030)
Squamous Epithelial / LPF: NONE SEEN
pH: 6 (ref 5.0–8.0)

## 2017-05-10 LAB — CBC WITH DIFFERENTIAL/PLATELET
BASOS PCT: 1 %
Basophils Absolute: 0.1 10*3/uL (ref 0–0.1)
EOS ABS: 0.1 10*3/uL (ref 0–0.7)
Eosinophils Relative: 1 %
HCT: 38.3 % (ref 35.0–47.0)
HEMOGLOBIN: 12.5 g/dL (ref 12.0–16.0)
Lymphocytes Relative: 13 %
Lymphs Abs: 1.2 10*3/uL (ref 1.0–3.6)
MCH: 28.9 pg (ref 26.0–34.0)
MCHC: 32.7 g/dL (ref 32.0–36.0)
MCV: 88.4 fL (ref 80.0–100.0)
MONOS PCT: 10 %
Monocytes Absolute: 0.9 10*3/uL (ref 0.2–0.9)
NEUTROS PCT: 75 %
Neutro Abs: 7.1 10*3/uL — ABNORMAL HIGH (ref 1.4–6.5)
Platelets: 279 10*3/uL (ref 150–440)
RBC: 4.34 MIL/uL (ref 3.80–5.20)
RDW: 14 % (ref 11.5–14.5)
WBC: 9.4 10*3/uL (ref 3.6–11.0)

## 2017-05-11 LAB — URINE CULTURE

## 2017-05-14 ENCOUNTER — Encounter
Admission: RE | Admit: 2017-05-14 | Discharge: 2017-05-14 | Disposition: A | Discharge status: Home / Self Care | Payer: PPO | Source: Ambulatory Visit | Attending: Internal Medicine | Admitting: Internal Medicine

## 2017-06-08 ENCOUNTER — Non-Acute Institutional Stay: Payer: PPO | Admitting: Gerontology

## 2017-06-08 ENCOUNTER — Encounter: Payer: Self-pay | Admitting: Gerontology

## 2017-06-08 DIAGNOSIS — E538 Deficiency of other specified B group vitamins: Secondary | ICD-10-CM | POA: Diagnosis not present

## 2017-06-08 DIAGNOSIS — E559 Vitamin D deficiency, unspecified: Secondary | ICD-10-CM

## 2017-06-08 DIAGNOSIS — M810 Age-related osteoporosis without current pathological fracture: Secondary | ICD-10-CM | POA: Diagnosis not present

## 2017-06-09 NOTE — Assessment & Plan Note (Signed)
Stable. On Cholecalciferol 2,000 units po Q Day. Recent Vitamin D level 41.7

## 2017-06-09 NOTE — Assessment & Plan Note (Signed)
Stable. No recent falls or fractures. Symptoms managed with Cholecalciferol 2,000 units po Q Day

## 2017-06-09 NOTE — Progress Notes (Signed)
Location:    [facility]ursing Home Room Number: 073X Place of Service:  [phone]) Provider:  Toni Arthurs, NP-C  Kirk Ruths, MD  Patient Care Team: Kirk Ruths, MD as PCP - General (Internal Medicine) Toni Arthurs, NP as Nurse Practitioner [facility] Medicine)  Extended Emergency Contact Information Primary Emergency Contact: Pottinger,Richard Address: [address], [place] [id number] Johnnette Litter of [place] Phone: [phone] Relation: Son Secondary Emergency Contact: [place] of [place] Phone: [phone] Relation: Son  Code Status:  *DNR Goals of care: Advanced Directive information Advanced Directives 06/08/2017  Does Patient Have a Medical Advance Directive? Yes  Type of Advance Directive Out of facility DNR (pink MOST or yellow form);Healthcare Power of Attorney  Does patient want to make changes to medical advance directive? No - Patient declined  Copy of [place] in Chart? Yes  Pre-existing out of facility DNR order (yellow form or pink MOST form) Yellow form placed in chart (order not valid for inpatient use)     Chief Complaint  Patient presents with  . Medical Management of Chronic Issues    Routine Visit    HPI:  Pt is a 84 y.o. female seen today for medical management of chronic diseases.    Age-related osteoporosis without current pathological fracture Stable. No recent falls or fractures. Symptoms managed with Cholecalciferol 2,000 units po Q Day  Vitamin D deficiency Stable. On Cholecalciferol 2,000 units po Q Day. Recent Vitamin D level 41.7  Vitamin B12 deficiency Stable. Symptoms managed with Cyanocobalamin 1,000 mcg po Q Day. Recent B12 level 1,150.  Please note pt with limited/cognitive verbal ability. Unable to obtain complete ROS. Some ROS info obtained from staff and documentation.   Past Medical History:  Diagnosis Date  . Diabetes mellitus    diet controlled  . Glaucoma   .  Hypertension   . Vascular dementia    Past Surgical History:  Procedure Laterality Date  . CATARACT EXTRACTION      Allergies  Allergen Reactions  . Sulfa Antibiotics     Allergies as of 06/08/2017      Reactions   Sulfa Antibiotics       Medication List        Accurate as of 06/08/17 11:59 PM. Always use your most recent med list.          ACCU-CHEK SOFTCLIX LANCETS lancets 1 each by Other route daily. Use as instructed   acetaminophen 325 MG tablet Commonly known as:  TYLENOL Take 650 mg by mouth every 4 (four) hours as needed. for pain and/or increased temp.   amLODipine 5 MG tablet Commonly known as:  NORVASC Take 5 mg by mouth daily.   aspirin EC 81 MG tablet Take 81 mg by mouth daily.   BREO ELLIPTA 100-25 MCG/INH Aepb Generic drug:  fluticasone furoate-vilanterol Inhale 1 puff into the lungs daily. Rinse mouth well after use   brinzolamide 1 % ophthalmic suspension Commonly known as:  AZOPT Place 1 drop into both eyes every 12 (twelve) hours.   BYSTOLIC 20 MG Tabs Generic drug:  Nebivolol HCl Take 1 tablet by mouth daily   Cholecalciferol 2000 units Caps Take 1 capsule by mouth daily.   cyanocobalamin 1000 MCG tablet Take 1,000 mcg by mouth daily.   glipiZIDE 5 MG tablet Commonly known as:  GLUCOTROL Take 1 tablet by mouth every morning before breakfast   glucose blood test strip Commonly known  as:  ACCU-CHEK AVIVA Check blood sugar twice daily Dx 250.00   ICAPS PO Take 1 tablet by mouth 2 (two) times daily.   losartan 100 MG tablet Commonly known as:  COZAAR Take 1 tablet by mouth daily   polyethylene glycol packet Commonly known as:  MIRALAX / GLYCOLAX Take 17 g by mouth daily.   pravastatin 10 MG tablet Commonly known as:  PRAVACHOL Take 10 mg by mouth once a week. On Sunday   sertraline 50 MG tablet Commonly known as:  ZOLOFT Take 50 mg by mouth daily.   spironolactone 25 MG tablet Commonly known as:  ALDACTONE Take 25 mg  by mouth daily.   torsemide 5 MG tablet Commonly known as:  DEMADEX Take 5 mg by mouth daily.   traMADol 50 MG tablet Commonly known as:  ULTRAM Take 1 tablet (50 mg total) by mouth every 6 (six) hours as needed.       Review of Systems  Unable to perform ROS: Dementia  Constitutional: Negative for activity change, appetite change, chills, diaphoresis and fever.  HENT: Negative for congestion, mouth sores, nosebleeds, postnasal drip, sneezing, sore throat, trouble swallowing and voice change.   Respiratory: Negative for apnea, cough, choking, chest tightness, shortness of breath and wheezing.   Cardiovascular: Negative for chest pain, palpitations and leg swelling.  Gastrointestinal: Negative for abdominal distention, abdominal pain, constipation, diarrhea and nausea.  Genitourinary: Negative for difficulty urinating, dysuria, frequency and urgency.  Musculoskeletal: Positive for arthralgias (typical arthritis). Negative for back pain, gait problem and myalgias.  Skin: Negative for color change, pallor, rash and wound.  Neurological: Positive for weakness. Negative for dizziness, tremors, syncope, speech difficulty, numbness and headaches.  Psychiatric/Behavioral: Negative for agitation and behavioral problems.  All other systems reviewed and are negative.   Immunization History  Administered Date(s) Administered  . Influenza Split 11/30/2011, 12/05/2012, 11/13/2013  . Influenza-Unspecified 11/16/2014, 11/07/2016  . PPD Test 01/31/2016  . Pneumococcal Conjugate-13 04/23/2013  . Pneumococcal Polysaccharide-23 10/06/2011  . Tdap 09/15/2014  . Zoster 04/23/2005   Pertinent  Health Maintenance Due  Topic Date Due  . FOOT EXAM  03/07/2016  . OPHTHALMOLOGY EXAM  06/07/2017  . INFLUENZA VACCINE  09/13/2017  . HEMOGLOBIN A1C  11/08/2017  . DEXA SCAN  Completed  . PNA vac Low Risk Adult  Completed   Fall Risk  02/16/2015 09/17/2014 11/08/2013 12/17/2012  Falls in the past year? No No  No No   Functional Status Survey:    Vitals:   06/08/17 1107  BP: 138/66  Pulse: 66  Resp: 18  Temp: (!) 96.6 F (35.9 C)  TempSrc: Oral  SpO2: 98%  Weight: 92 lb 11.2 oz (42 kg)  Height: 5\' 5"  (1.651 m)   Body mass index is 15.43 kg/m. Physical Exam  Constitutional: Vital signs are normal. She appears well-developed and well-nourished. She is active and cooperative. She does not appear ill. No distress.  HENT:  Head: Normocephalic and atraumatic.  Mouth/Throat: Uvula is midline, oropharynx is clear and moist and mucous membranes are normal. Mucous membranes are not pale, not dry and not cyanotic.  Eyes: Pupils are equal, round, and reactive to light. Conjunctivae, EOM and lids are normal.  Neck: Trachea normal, normal range of motion and full passive range of motion without pain. Neck supple. No JVD present. No tracheal deviation, no edema and no erythema present. No thyromegaly present.  Cardiovascular: Normal rate, normal heart sounds, intact distal pulses and normal pulses. An irregular rhythm present. Exam  reveals no gallop, no distant heart sounds and no friction rub.  No murmur heard. Pulses:      Dorsalis pedis pulses are 2+ on the right side, and 2+ on the left side.  No edema  Pulmonary/Chest: Effort normal and breath sounds normal. No accessory muscle usage. No respiratory distress. She has no decreased breath sounds. She has no wheezes. She has no rhonchi. She has no rales. She exhibits no tenderness.  Abdominal: Soft. Normal appearance and bowel sounds are normal. She exhibits no distension and no ascites. There is no tenderness.  Musculoskeletal: Normal range of motion. She exhibits no edema or tenderness.  Expected osteoarthritis, stiffness; Bilateral Calves soft, supple. Negative Homan's Sign. B- pedal pulses equal  Neurological: She is alert. She has normal strength. She exhibits abnormal muscle tone.  Skin: Skin is warm, dry and intact. She is not diaphoretic. No  cyanosis. No pallor. Nails show no clubbing.  Psychiatric: She has a normal mood and affect. Her speech is normal and behavior is normal. Judgment and thought content normal. Cognition and memory are impaired. She exhibits abnormal recent memory.  Nursing note and vitals reviewed.   Labs reviewed: Recent Labs    07/11/16 0430 11/02/16 0500 05/08/17 0505  NA 136 134* 131*  K 4.9 4.5 4.2  CL 99* 100* 95*  CO2 31 28 28   GLUCOSE 95 101* 223*  BUN 29* 34* 24*  CREATININE 0.76 1.03* 0.74  CALCIUM 9.2 9.1 8.8*  MG  --  2.1 2.1   Recent Labs    07/11/16 0430 11/02/16 0500 05/08/17 0505  AST 23 24 18   ALT 34 36 22  ALKPHOS 103 72 88  BILITOT 1.1 1.0 0.8  PROT 6.7 6.4* 6.6  ALBUMIN 3.8 3.8 3.6   Recent Labs    11/02/16 0500 05/08/17 0505 05/10/17 0830  WBC 8.3 13.9* 9.4  NEUTROABS 5.4  --  7.1*  HGB 11.6* 12.0 12.5  HCT 33.5* 37.0 38.3  MCV 86.2 88.2 88.4  PLT 246 313 279   Lab Results  Component Value Date   TSH 0.986 05/08/2017   Lab Results  Component Value Date   HGBA1C 6.8 (H) 05/08/2017   Lab Results  Component Value Date   CHOL 167 05/08/2017   HDL 59 05/08/2017   LDLCALC 89 05/08/2017   LDLDIRECT 97.0 06/09/2015   TRIG 95 05/08/2017   CHOLHDL 2.8 05/08/2017    Significant Diagnostic Results in last 30 days:  No results found.  Assessment/Plan [place] was seen today for medical management of chronic issues.  Diagnoses and all orders for this visit:  Age-related osteoporosis without current pathological fracture  Vitamin D deficiency  Vitamin B12 deficiency   Above listed conditions stable  Continue current medication regimen  Continue to Re-orient as necessary  Assist with ADLs, mobility, etc as necessary  Safety precautions  Fall precautions  Continue to be followed by Palliative Care  Family/ staff Communication:   Total Time:  Documentation:  Face to Face:  Family/Phone:   Labs/tests ordered:  Recent labs reviewed-  stable. Not due  Medication list reviewed and assessed for continued appropriateness. Monthly medication orders reviewed and signed.  Vikki Ports, NP-C [facility] [phone], [place] [address] Cell Phone (Mon-Fri 8am-5pm):  [phone] On Call:  [phone] & follow prompts after 5pm & weekends Office Phone:  [phone] Office Fax:  [phone]

## 2017-06-09 NOTE — Assessment & Plan Note (Signed)
Stable. Symptoms managed with Cyanocobalamin 1,000 mcg po Q Day. Recent B12 level 1,150.

## 2017-06-13 ENCOUNTER — Encounter
Admission: RE | Admit: 2017-06-13 | Discharge: 2017-06-13 | Disposition: A | Discharge status: Home / Self Care | Payer: PPO | Source: Ambulatory Visit | Attending: Internal Medicine | Admitting: Internal Medicine

## 2017-07-14 ENCOUNTER — Encounter: Admission: RE | Admit: 2017-07-14 | Payer: PPO | Source: Ambulatory Visit | Admitting: Internal Medicine

## 2017-07-14 DEATH — deceased

## 2017-08-19 IMAGING — CR DG CHEST 2V
2 series · 2 of 2 positions shown · non-contrast
Comparison: 09/16/2014

CLINICAL DATA: Confusion; Per [facility]: Pt. From [REDACTED]
d/t change in mentation at lunch time. New med started this am-cipro
for UTI. Hx/o HTN. Nonsmoker.

EXAM:
CHEST  2 VIEW

[chest lat]
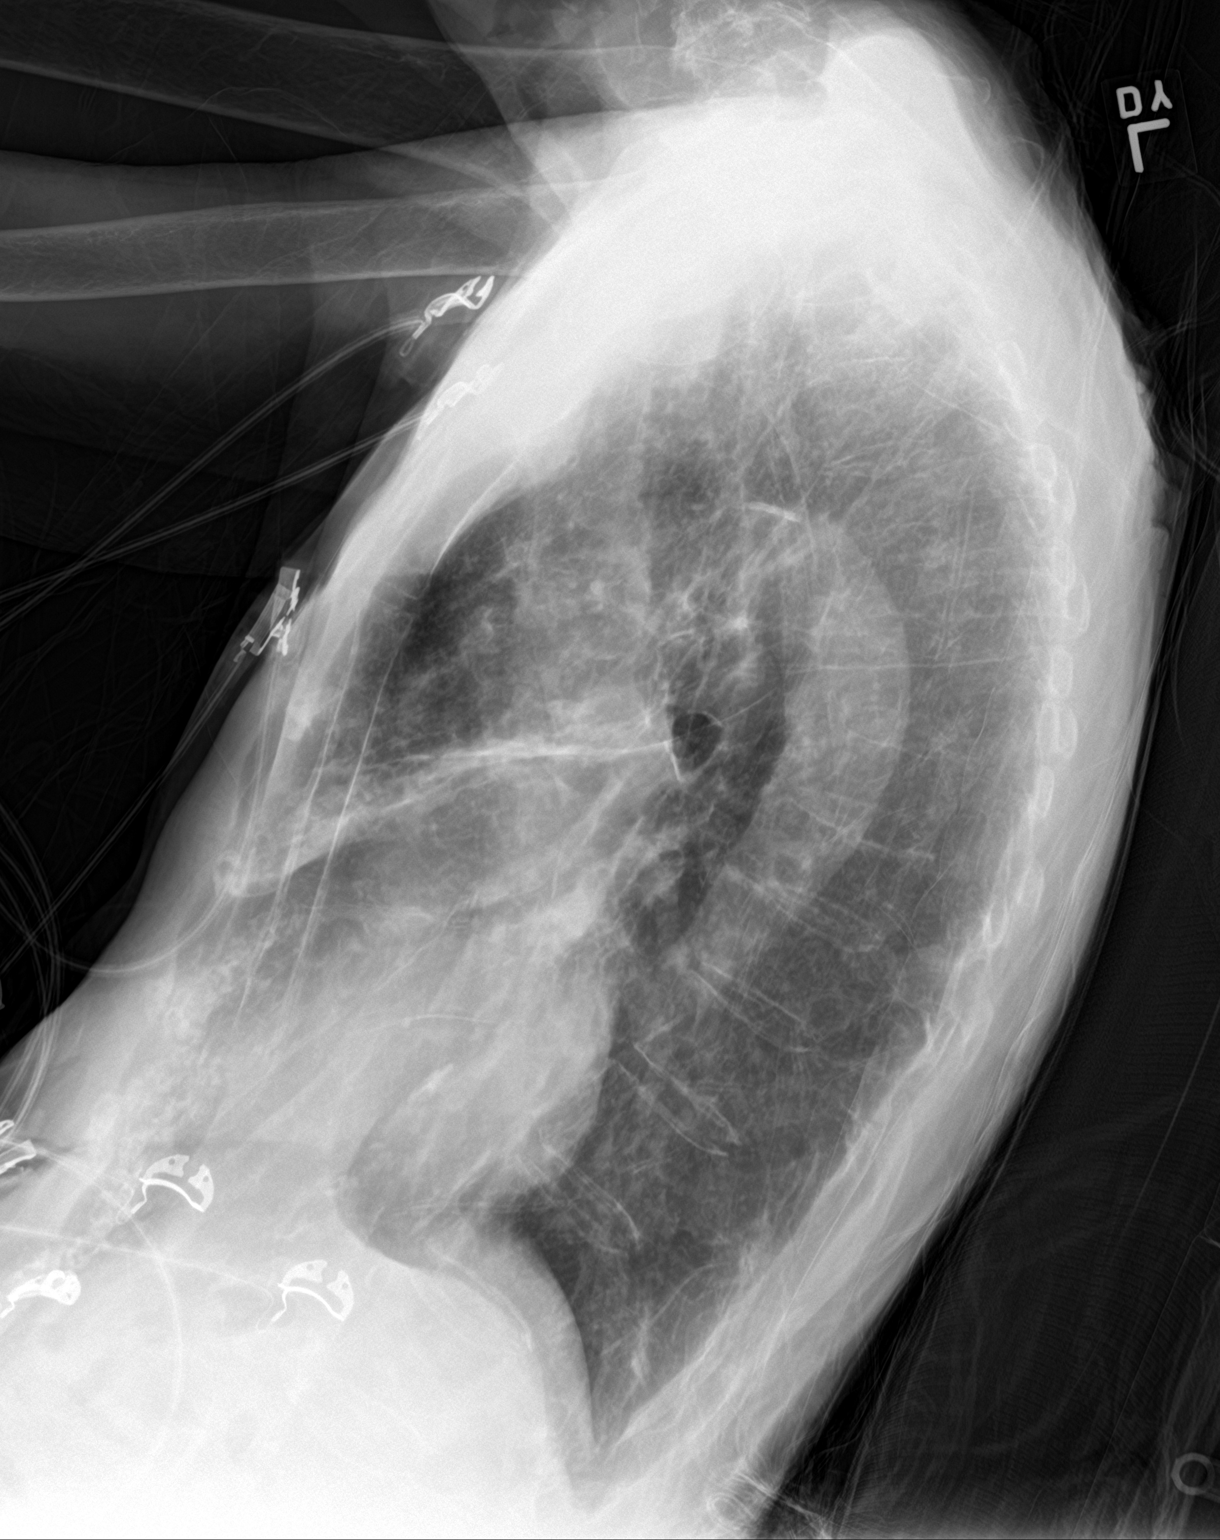

[chest ap]
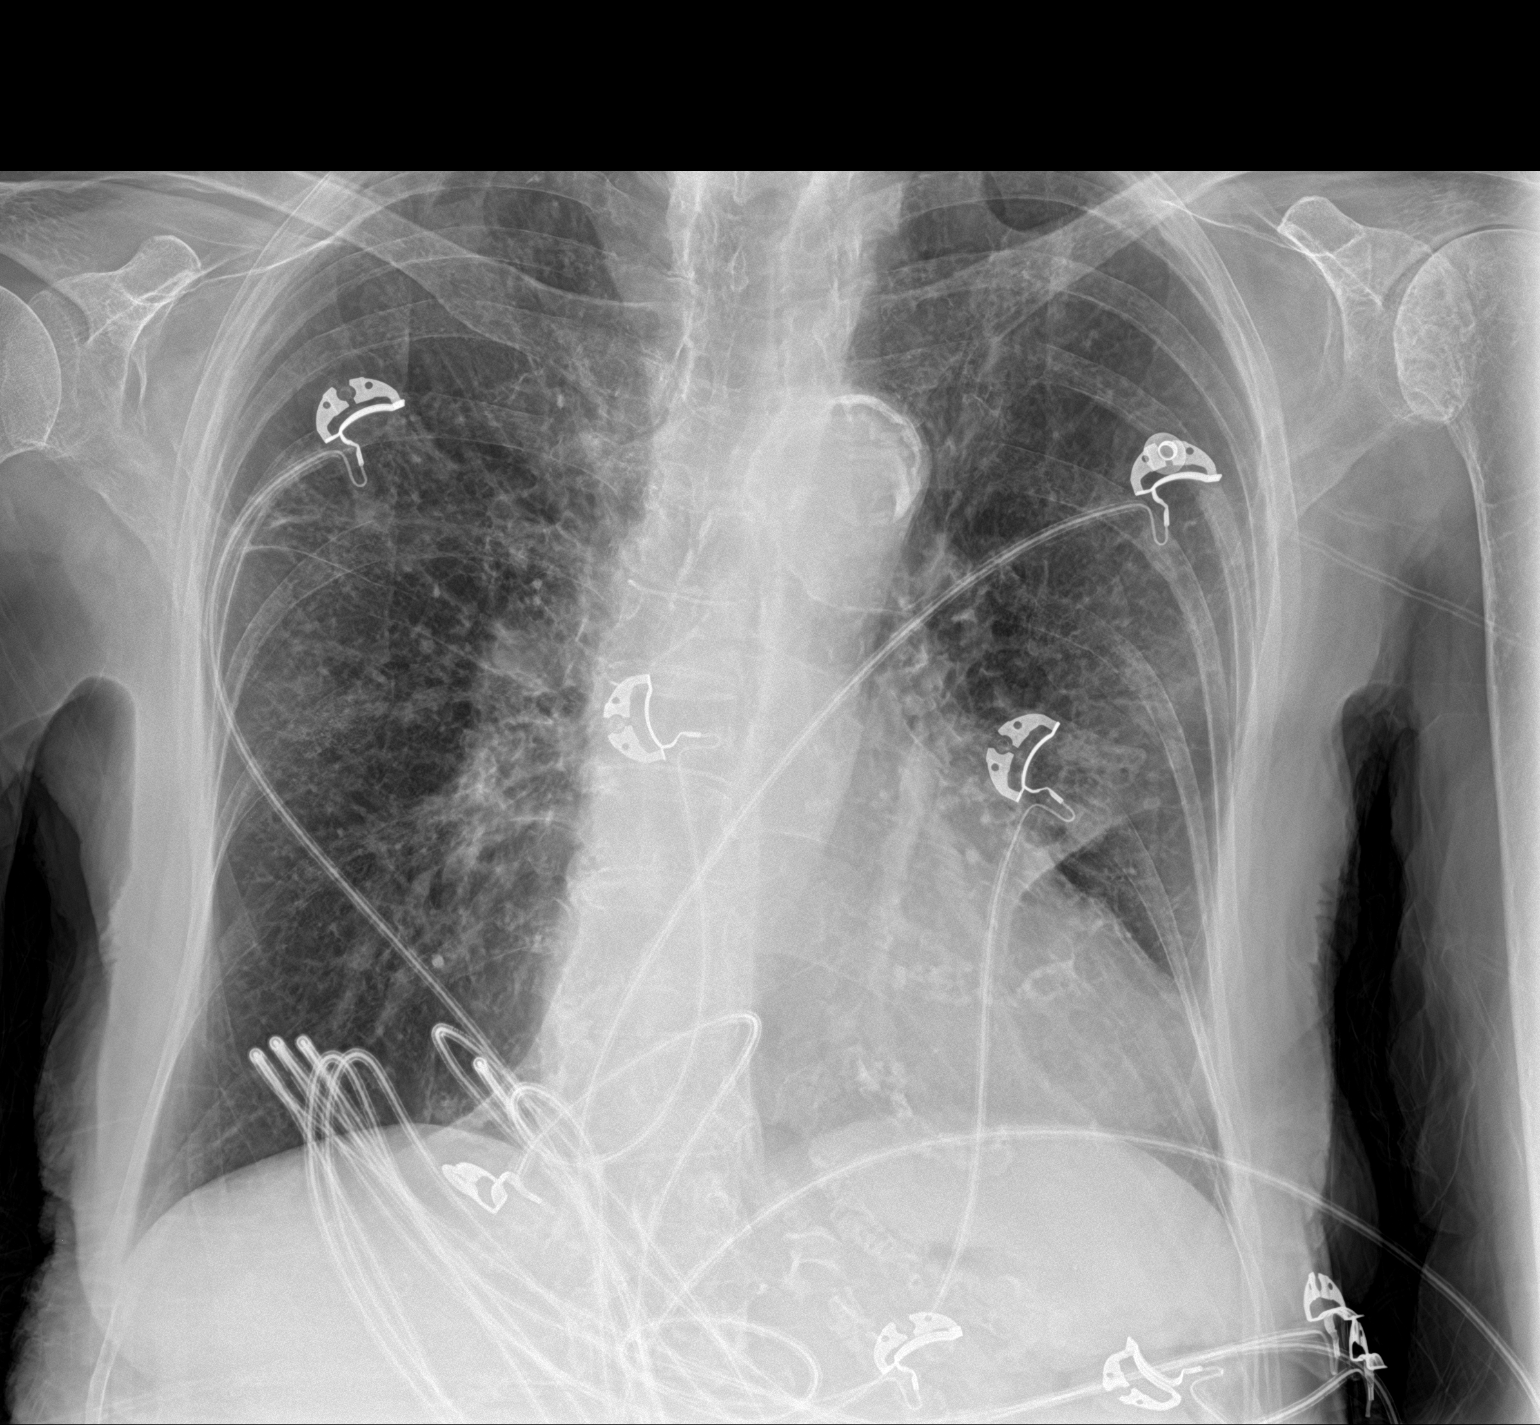

[2 of 2 positions shown; findings below may reference images not displayed]

FINDINGS: Cardiac silhouette is mildly enlarged. No mediastinal or hilar
masses. No convincing adenopathy.

Lungs are hyperexpanded. There are stable areas of lung scarring. No
evidence of pneumonia or pulmonary edema.

No pleural effusion or pneumothorax.

Skeletal structures are demineralized but grossly intact.
IMPRESSION: 1. No acute cardiopulmonary disease.
2. Chronic lung changes consistent with COPD and scarring.
